# Patient Record
Sex: Male | Born: 1975 | Race: White | Hispanic: No | Marital: Married | Smoking: Never smoker
Health system: Southern US, Community
[De-identification: ages and names within clinical notes are randomized; demographics above are authoritative.]

## PROBLEM LIST (undated history)

## (undated) DIAGNOSIS — E785 Hyperlipidemia, unspecified: Secondary | ICD-10-CM

## (undated) DIAGNOSIS — R748 Abnormal levels of other serum enzymes: Secondary | ICD-10-CM

## (undated) DIAGNOSIS — M1990 Unspecified osteoarthritis, unspecified site: Secondary | ICD-10-CM

## (undated) DIAGNOSIS — T7840XA Allergy, unspecified, initial encounter: Secondary | ICD-10-CM

## (undated) DIAGNOSIS — J45909 Unspecified asthma, uncomplicated: Secondary | ICD-10-CM

## (undated) HISTORY — DX: Unspecified osteoarthritis, unspecified site: M19.90

## (undated) HISTORY — DX: Abnormal levels of other serum enzymes: R74.8

## (undated) HISTORY — PX: TESTICLE SURGERY: SHX794

## (undated) HISTORY — DX: Unspecified asthma, uncomplicated: J45.909

## (undated) HISTORY — DX: Hyperlipidemia, unspecified: E78.5

## (undated) HISTORY — DX: Allergy, unspecified, initial encounter: T78.40XA

---

## 2011-12-20 HISTORY — PX: VASECTOMY: SHX75

## 2020-02-06 ENCOUNTER — Ambulatory Visit: Payer: Self-pay | Admitting: Family Medicine

## 2020-02-17 ENCOUNTER — Other Ambulatory Visit: Payer: Self-pay

## 2020-02-17 ENCOUNTER — Encounter: Payer: Self-pay | Admitting: Family Medicine

## 2020-02-17 ENCOUNTER — Ambulatory Visit (INDEPENDENT_AMBULATORY_CARE_PROVIDER_SITE_OTHER): Payer: 59 | Admitting: Family Medicine

## 2020-02-17 VITALS — BP 124/82 | HR 69 | Temp 97.1°F | Ht 75.0 in | Wt 281.2 lb

## 2020-02-17 DIAGNOSIS — Z23 Encounter for immunization: Secondary | ICD-10-CM | POA: Diagnosis not present

## 2020-02-17 DIAGNOSIS — R748 Abnormal levels of other serum enzymes: Secondary | ICD-10-CM | POA: Insufficient documentation

## 2020-02-17 DIAGNOSIS — Z0000 Encounter for general adult medical examination without abnormal findings: Secondary | ICD-10-CM | POA: Diagnosis not present

## 2020-02-17 NOTE — Patient Instructions (Signed)
Health Maintenance, Male Adopting a healthy lifestyle and getting preventive care are important in promoting health and wellness. Ask your health care provider about:  The right schedule for you to have regular tests and exams.  Things you can do on your own to prevent diseases and keep yourself healthy. What should I know about diet, weight, and exercise? Eat a healthy diet   Eat a diet that includes plenty of vegetables, fruits, low-fat dairy products, and lean protein.  Do not eat a lot of foods that are high in solid fats, added sugars, or sodium. Maintain a healthy weight Body mass index (BMI) is a measurement that can be used to identify possible weight problems. It estimates body fat based on height and weight. Your health care provider can help determine your BMI and help you achieve or maintain a healthy weight. Get regular exercise Get regular exercise. This is one of the most important things you can do for your health. Most adults should:  Exercise for at least 150 minutes each week. The exercise should increase your heart rate and make you sweat (moderate-intensity exercise).  Do strengthening exercises at least twice a week. This is in addition to the moderate-intensity exercise.  Spend less time sitting. Even light physical activity can be beneficial. Watch cholesterol and blood lipids Have your blood tested for lipids and cholesterol at 45 years of age, then have this test every 5 years. You may need to have your cholesterol levels checked more often if:  Your lipid or cholesterol levels are high.  You are older than 45 years of age.  You are at high risk for heart disease. What should I know about cancer screening? Many types of cancers can be detected early and may often be prevented. Depending on your health history and family history, you may need to have cancer screening at various ages. This may include screening for:  Colorectal cancer.  Prostate cancer.   Skin cancer.  Lung cancer. What should I know about heart disease, diabetes, and high blood pressure? Blood pressure and heart disease  High blood pressure causes heart disease and increases the risk of stroke. This is more likely to develop in people who have high blood pressure readings, are of African descent, or are overweight.  Talk with your health care provider about your target blood pressure readings.  Have your blood pressure checked: ? Every 3-5 years if you are 18-39 years of age. ? Every year if you are 40 years old or older.  If you are between the ages of 65 and 75 and are a current or former smoker, ask your health care provider if you should have a one-time screening for abdominal aortic aneurysm (AAA). Diabetes Have regular diabetes screenings. This checks your fasting blood sugar level. Have the screening done:  Once every three years after age 45 if you are at a normal weight and have a low risk for diabetes.  More often and at a younger age if you are overweight or have a high risk for diabetes. What should I know about preventing infection? Hepatitis B If you have a higher risk for hepatitis B, you should be screened for this virus. Talk with your health care provider to find out if you are at risk for hepatitis B infection. Hepatitis C Blood testing is recommended for:  Everyone born from 1945 through 1965.  Anyone with known risk factors for hepatitis C. Sexually transmitted infections (STIs)  You should be screened each year   for STIs, including gonorrhea and chlamydia, if: ? You are sexually active and are younger than 45 years of age. ? You are older than 45 years of age and your health care provider tells you that you are at risk for this type of infection. ? Your sexual activity has changed since you were last screened, and you are at increased risk for chlamydia or gonorrhea. Ask your health care provider if you are at risk.  Ask your health care  provider about whether you are at high risk for HIV. Your health care provider may recommend a prescription medicine to help prevent HIV infection. If you choose to take medicine to prevent HIV, you should first get tested for HIV. You should then be tested every 3 months for as long as you are taking the medicine. Follow these instructions at home: Lifestyle  Do not use any products that contain nicotine or tobacco, such as cigarettes, e-cigarettes, and chewing tobacco. If you need help quitting, ask your health care provider.  Do not use street drugs.  Do not share needles.  Ask your health care provider for help if you need support or information about quitting drugs. Alcohol use  Do not drink alcohol if your health care provider tells you not to drink.  If you drink alcohol: ? Limit how much you have to 0-2 drinks a day. ? Be aware of how much alcohol is in your drink. In the [place] one drink equals one 12 oz bottle of beer (355 mL), one 5 oz glass of wine (148 mL), or one 1 oz glass of hard liquor (44 mL). General instructions  Schedule regular health, dental, and eye exams.  Stay current with your vaccines.  Tell your health care provider if: ? You often feel depressed. ? You have ever been abused or do not feel safe at home. Summary  Adopting a healthy lifestyle and getting preventive care are important in promoting health and wellness.  Follow your health care provider's instructions about healthy diet, exercising, and getting tested or screened for diseases.  Follow your health care provider's instructions on monitoring your cholesterol and blood pressure. This information is not intended to replace advice given to you by your health care provider. Make sure you discuss any questions you have with your health care provider. Document Revised: 11/28/2018 Document Reviewed: 11/28/2018 Elsevier Patient Education  2020 [employer]  Preventive Care 61-26 Years Old, Male  Preventive care refers to lifestyle choices and visits with your health care provider that can promote health and wellness. This includes:  A yearly physical exam. This is also called an annual well check.  Regular dental and eye exams.  Immunizations.  Screening for certain conditions.  Healthy lifestyle choices, such as eating a healthy diet, getting regular exercise, not using drugs or products that contain nicotine and tobacco, and limiting alcohol use. What can I expect for my preventive care visit? Physical exam Your health care provider will check:  Height and weight. These may be used to calculate body mass index (BMI), which is a measurement that tells if you are at a healthy weight.  Heart rate and blood pressure.  Your skin for abnormal spots. Counseling Your health care provider may ask you questions about:  Alcohol, tobacco, and drug use.  Emotional well-being.  Home and relationship well-being.  Sexual activity.  Eating habits.  Work and work Statistician. What immunizations do I need?  Influenza (flu) vaccine  This is recommended every year. Tetanus, diphtheria,  and pertussis (Tdap) vaccine  You may need a Td booster every 10 years. Varicella (chickenpox) vaccine  You may need this vaccine if you have not already been vaccinated. Zoster (shingles) vaccine  You may need this after age 32. Measles, mumps, and rubella (MMR) vaccine  You may need at least one dose of MMR if you were born in 1957 or later. You may also need a second dose. Pneumococcal conjugate (PCV13) vaccine  You may need this if you have certain conditions and were not previously vaccinated. Pneumococcal polysaccharide (PPSV23) vaccine  You may need one or two doses if you smoke cigarettes or if you have certain conditions. Meningococcal conjugate (MenACWY) vaccine  You may need this if you have certain conditions. Hepatitis A vaccine  You may need this if you have certain  conditions or if you travel or work in places where you may be exposed to hepatitis A. Hepatitis B vaccine  You may need this if you have certain conditions or if you travel or work in places where you may be exposed to hepatitis B. Haemophilus influenzae type b (Hib) vaccine  You may need this if you have certain risk factors. Human papillomavirus (HPV) vaccine  If recommended by your health care provider, you may need three doses over 6 months. You may receive vaccines as individual doses or as more than one vaccine together in one shot (combination vaccines). Talk with your health care provider about the risks and benefits of combination vaccines. What tests do I need? Blood tests  Lipid and cholesterol levels. These may be checked every 5 years, or more frequently if you are over 84 years old.  Hepatitis C test.  Hepatitis B test. Screening  Lung cancer screening. You may have this screening every year starting at age 51 if you have a 30-pack-year history of smoking and currently smoke or have quit within the past 15 years.  Prostate cancer screening. Recommendations will vary depending on your family history and other risks.  Colorectal cancer screening. All adults should have this screening starting at age 27 and continuing until age 50. Your health care provider may recommend screening at age 36 if you are at increased risk. You will have tests every 1-10 years, depending on your results and the type of screening test.  Diabetes screening. This is done by checking your blood sugar (glucose) after you have not eaten for a while (fasting). You may have this done every 1-3 years.  Sexually transmitted disease (STD) testing. Follow these instructions at home: Eating and drinking  Eat a diet that includes fresh fruits and vegetables, whole grains, lean protein, and low-fat dairy products.  Take vitamin and mineral supplements as recommended by your health care provider.  Do not  drink alcohol if your health care provider tells you not to drink.  If you drink alcohol: ? Limit how much you have to 0-2 drinks a day. ? Be aware of how much alcohol is in your drink. In the [place] one drink equals one 12 oz bottle of beer (355 mL), one 5 oz glass of wine (148 mL), or one 1 oz glass of hard liquor (44 mL). Lifestyle  Take daily care of your teeth and gums.  Stay active. Exercise for at least 30 minutes on 5 or more days each week.  Do not use any products that contain nicotine or tobacco, such as cigarettes, e-cigarettes, and chewing tobacco. If you need help quitting, ask your health care provider.  If  you are sexually active, practice safe sex. Use a condom or other form of protection to prevent STIs (sexually transmitted infections).  Talk with your health care provider about taking a low-dose aspirin every day starting at age 50. What's next?  Go to your health care provider once a year for a well check visit.  Ask your health care provider how often you should have your eyes and teeth checked.  Stay up to date on all vaccines. This information is not intended to replace advice given to you by your health care provider. Make sure you discuss any questions you have with your health care provider. Document Revised: 11/29/2018 Document Reviewed: 11/29/2018 Elsevier Patient Education  2020 [employer] Inc.  

## 2020-02-17 NOTE — Progress Notes (Signed)
New Patient Office Visit  Subjective:  Patient ID: Jorge Morris, male    DOB: 1976/12/16  Age: 44 y.o. MRN: [id number]  CC:  Chief Complaint  Patient presents with  . Establish Care    New patient, no concerns.     HPI Jorge Morris presents for establishment of care and a physical exam.  He is nonfasting today.  He brings in labs that were drawn at his company back in November.  Labs were essentially normal but AST was elevated, LDL mildly elevated at 128 with an HDL of 33.  He is healthy as far as he knows and does not take any medications.  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  He is married and has 4 children of various ages.  He does not smoke or use illicit drugs.  He rarely drinks alcohol.  Just started working out again this past month.  He is in graduate school time and short for him.  Just had dental care this past month. History reviewed. No pertinent past medical history.  Past Surgical History:  Procedure Laterality Date  . TESTICLE SURGERY      Family History  Problem Relation Age of Onset  . Cancer Mother   . Healthy Father   . Cancer Maternal Grandmother   . Cancer Maternal Grandfather     Social History   Socioeconomic History  . Marital status: Married    Spouse name: Not on file  . Number of children: Not on file  . Years of education: Not on file  . Highest education level: Not on file  Occupational History  . Not on file  Tobacco Use  . Smoking status: Never Smoker  . Smokeless tobacco: Never Used  Substance and Sexual Activity  . Alcohol use: Not Currently  . Drug use: Never  . Sexual activity: Yes  Other Topics Concern  . Not on file  Social History Narrative  . Not on file   Social Determinants of Health   Financial Resource Strain:   . Difficulty of Paying Living Expenses: Not on file  Food Insecurity:   . Worried About Programme researcher, broadcasting/film/video in the Last Year: Not on file  . Ran Out of Food in the Last Year: Not on file    Transportation Needs:   . Lack of Transportation (Medical): Not on file  . Lack of Transportation (Non-Medical): Not on file  Physical Activity:   . Days of Exercise per Week: Not on file  . Minutes of Exercise per Session: Not on file  Stress:   . Feeling of Stress : Not on file  Social Connections:   . Frequency of Communication with Friends and Family: Not on file  . Frequency of Social Gatherings with Friends and Family: Not on file  . Attends Religious Services: Not on file  . Active Member of Clubs or Organizations: Not on file  . Attends Banker Meetings: Not on file  . Marital Status: Not on file  Intimate Partner Violence:   . Fear of Current or Ex-Partner: Not on file  . Emotionally Abused: Not on file  . Physically Abused: Not on file  . Sexually Abused: Not on file    ROS Review of Systems  Constitutional: Negative.   HENT: Negative.   Eyes: Negative for photophobia and visual disturbance.  Respiratory: Negative.   Cardiovascular: Negative.   Gastrointestinal: Negative.   Endocrine: Negative for polyphagia and polyuria.  Genitourinary: Negative.  Musculoskeletal: Negative for gait problem and joint swelling.  Skin: Negative for pallor and rash.  Allergic/Immunologic: Negative for immunocompromised state.  Neurological: Negative for light-headedness and headaches.  Hematological: Does not bruise/bleed easily.  Psychiatric/Behavioral: Negative.     Objective:   Today's Vitals: BP 124/82   Pulse 69   Temp (!) 97.1 F (36.2 C) (Tympanic)   Ht 6\' 3"  (1.905 m)   Wt 281 lb 3.2 oz (127.6 kg)   SpO2 95%   BMI 35.15 kg/m   Physical Exam Constitutional:      General: He is not in acute distress.    Appearance: Normal appearance. He is not ill-appearing, toxic-appearing or diaphoretic.  HENT:     Head: Atraumatic.     Right Ear: Tympanic membrane, ear canal and external ear normal. There is no impacted cerumen.     Left Ear: Tympanic  membrane, ear canal and external ear normal. There is no impacted cerumen.  Eyes:     General: No scleral icterus.       Right eye: No discharge.        Left eye: No discharge.     Extraocular Movements: Extraocular movements intact.     Conjunctiva/sclera: Conjunctivae normal.     Pupils: Pupils are equal, round, and reactive to light.  Cardiovascular:     Rate and Rhythm: Normal rate and regular rhythm.  Pulmonary:     Effort: Pulmonary effort is normal.     Breath sounds: Normal breath sounds.  Abdominal:     General: Abdomen is flat. Bowel sounds are normal.     Palpations: Abdomen is soft. There is no mass.     Tenderness: There is no guarding or rebound.     Hernia: No hernia is present. There is no hernia in the left inguinal area or right inguinal area.  Genitourinary:    Penis: Uncircumcised. No phimosis, paraphimosis, hypospadias, erythema, tenderness, discharge, swelling or lesions.      Testes: Normal.        Right: Mass, tenderness or swelling not present. Right testis is descended.        Left: Tenderness or swelling not present. Left testis is descended.     Epididymis:     Right: Not inflamed.     Left: Not inflamed.  Musculoskeletal:     Cervical back: No rigidity or tenderness.     Right lower leg: No edema.     Left lower leg: No edema.  Lymphadenopathy:     Cervical: No cervical adenopathy.     Lower Body: No right inguinal adenopathy. No left inguinal adenopathy.  Skin:    General: Skin is warm and dry.     Coloration: Skin is not jaundiced.  Neurological:     Mental Status: He is alert and oriented to person, place, and time.  Psychiatric:        Mood and Affect: Mood normal.        Behavior: Behavior normal.     Assessment & Plan:   Problem List Items Addressed This Visit      Other   Elevated liver enzymes   Relevant Orders   Comprehensive metabolic panel   Gamma GT   Hepatitis C antibody   Hepatitis B Surface AntiGEN   Hepatitis B  surface antibody,quantitative   Healthcare maintenance - Primary   Relevant Orders   CBC   Comprehensive metabolic panel   LDL cholesterol, direct   Lipid panel   Urinalysis, Routine w reflex  microscopic   HIV Antibody (routine testing w rflx)    Other Visit Diagnoses    Need for Tdap vaccination       Relevant Orders   Tdap vaccine greater than or equal to 7yo IM (Completed)      No outpatient encounter medications on file as of 02/17/2020.   No facility-administered encounter medications on file as of 02/17/2020.    Follow-up: Return in about 6 months (around 08/19/2020), or return fasting for your blood work..   Patient was given information on health maintenance and disease prevention.  He will return fasting for above ordered blood work and again in 6 months for follow-up.  Libby Maw, MD

## 2020-02-21 ENCOUNTER — Other Ambulatory Visit (INDEPENDENT_AMBULATORY_CARE_PROVIDER_SITE_OTHER): Payer: 59

## 2020-02-21 ENCOUNTER — Other Ambulatory Visit: Payer: Self-pay

## 2020-02-21 DIAGNOSIS — R748 Abnormal levels of other serum enzymes: Secondary | ICD-10-CM

## 2020-02-21 DIAGNOSIS — Z0000 Encounter for general adult medical examination without abnormal findings: Secondary | ICD-10-CM

## 2020-02-21 LAB — URINALYSIS, ROUTINE W REFLEX MICROSCOPIC
Bilirubin Urine: NEGATIVE
Hgb urine dipstick: NEGATIVE
Ketones, ur: NEGATIVE
Leukocytes,Ua: NEGATIVE
Nitrite: NEGATIVE
Specific Gravity, Urine: 1.02 (ref 1.000–1.030)
Total Protein, Urine: NEGATIVE
Urine Glucose: NEGATIVE
Urobilinogen, UA: 0.2 (ref 0.0–1.0)
pH: 6.5 (ref 5.0–8.0)

## 2020-02-21 LAB — COMPREHENSIVE METABOLIC PANEL
ALT: 26 U/L (ref 0–53)
AST: 17 U/L (ref 0–37)
Albumin: 4.1 g/dL (ref 3.5–5.2)
Alkaline Phosphatase: 58 U/L (ref 39–117)
BUN: 20 mg/dL (ref 6–23)
CO2: 28 mEq/L (ref 19–32)
Calcium: 9.2 mg/dL (ref 8.4–10.5)
Chloride: 105 mEq/L (ref 96–112)
Creatinine, Ser: 1.41 mg/dL (ref 0.40–1.50)
GFR: 54.32 mL/min — ABNORMAL LOW (ref >60.00)
Glucose, Bld: 98 mg/dL (ref 70–99)
Potassium: 4.6 mEq/L (ref 3.5–5.1)
Sodium: 138 mEq/L (ref 135–145)
Total Bilirubin: 0.7 mg/dL (ref 0.2–1.2)
Total Protein: 6.4 g/dL (ref 6.0–8.3)

## 2020-02-21 LAB — LIPID PANEL
Cholesterol: 163 mg/dL (ref 0–200)
HDL: 28.8 mg/dL — ABNORMAL LOW (ref >39.00)
LDL Cholesterol: 118 mg/dL — ABNORMAL HIGH (ref 0–99)
NonHDL: 134.02
Total CHOL/HDL Ratio: 6
Triglycerides: 82 mg/dL (ref 0.0–149.0)
VLDL: 16.4 mg/dL (ref 0.0–40.0)

## 2020-02-21 LAB — CBC
HCT: 46.1 % (ref 39.0–52.0)
Hemoglobin: 15.6 g/dL (ref 13.0–17.0)
MCHC: 33.9 g/dL (ref 30.0–36.0)
MCV: 92.8 fl (ref 78.0–100.0)
Platelets: 195 10*3/uL (ref 150.0–400.0)
RBC: 4.96 Mil/uL (ref 4.22–5.81)
RDW: 12.9 % (ref 11.5–15.5)
WBC: 3.8 10*3/uL — ABNORMAL LOW (ref 4.0–10.5)

## 2020-02-21 LAB — GAMMA GT: GGT: 26 U/L (ref 7–51)

## 2020-02-21 LAB — LDL CHOLESTEROL, DIRECT: Direct LDL: 118 mg/dL

## 2020-02-24 LAB — HIV ANTIBODY (ROUTINE TESTING W REFLEX): HIV 1&2 Ab, 4th Generation: NONREACTIVE

## 2020-02-24 LAB — HEPATITIS B SURFACE ANTIBODY, QUANTITATIVE: Hep B S AB Quant (Post): <5 m[IU]/mL — ABNORMAL LOW (ref >=10)

## 2020-02-24 LAB — HEPATITIS C ANTIBODY
Hepatitis C Ab: NONREACTIVE
SIGNAL TO CUT-OFF: 0.01 (ref <1.00)

## 2020-02-24 LAB — HEPATITIS B SURFACE ANTIGEN: Hepatitis B Surface Ag: NONREACTIVE

## 2020-08-21 ENCOUNTER — Ambulatory Visit: Payer: 59 | Admitting: Family Medicine

## 2020-08-21 ENCOUNTER — Other Ambulatory Visit: Payer: Self-pay

## 2020-08-21 ENCOUNTER — Ambulatory Visit: Payer: 59

## 2020-08-21 ENCOUNTER — Ambulatory Visit (INDEPENDENT_AMBULATORY_CARE_PROVIDER_SITE_OTHER): Payer: 59

## 2020-08-21 ENCOUNTER — Encounter: Payer: Self-pay | Admitting: Family Medicine

## 2020-08-21 VITALS — BP 134/86 | HR 63 | Temp 97.8°F | Ht 75.0 in | Wt 265.8 lb

## 2020-08-21 DIAGNOSIS — E7800 Pure hypercholesterolemia, unspecified: Secondary | ICD-10-CM | POA: Diagnosis not present

## 2020-08-21 DIAGNOSIS — R748 Abnormal levels of other serum enzymes: Secondary | ICD-10-CM | POA: Diagnosis not present

## 2020-08-21 DIAGNOSIS — M25561 Pain in right knee: Secondary | ICD-10-CM | POA: Insufficient documentation

## 2020-08-21 DIAGNOSIS — E663 Overweight: Secondary | ICD-10-CM | POA: Diagnosis not present

## 2020-08-21 DIAGNOSIS — M25562 Pain in left knee: Secondary | ICD-10-CM | POA: Diagnosis not present

## 2020-08-21 LAB — LIPID PANEL
Cholesterol: 171 mg/dL (ref 0–200)
HDL: 30.9 mg/dL — ABNORMAL LOW (ref >39.00)
LDL Cholesterol: 125 mg/dL — ABNORMAL HIGH (ref 0–99)
NonHDL: 140.33
Total CHOL/HDL Ratio: 6
Triglycerides: 77 mg/dL (ref 0.0–149.0)
VLDL: 15.4 mg/dL (ref 0.0–40.0)

## 2020-08-21 LAB — CBC
HCT: 47.3 % (ref 39.0–52.0)
Hemoglobin: 15.9 g/dL (ref 13.0–17.0)
MCHC: 33.7 g/dL (ref 30.0–36.0)
MCV: 93.4 fl (ref 78.0–100.0)
Platelets: 202 10*3/uL (ref 150.0–400.0)
RBC: 5.06 Mil/uL (ref 4.22–5.81)
RDW: 12.9 % (ref 11.5–15.5)
WBC: 4.9 10*3/uL (ref 4.0–10.5)

## 2020-08-21 LAB — BASIC METABOLIC PANEL
BUN: 25 mg/dL — ABNORMAL HIGH (ref 6–23)
CO2: 26 mEq/L (ref 19–32)
Calcium: 9.2 mg/dL (ref 8.4–10.5)
Chloride: 105 mEq/L (ref 96–112)
Creatinine, Ser: 1.16 mg/dL (ref 0.40–1.50)
GFR: 67.89 mL/min (ref >60.00)
Glucose, Bld: 85 mg/dL (ref 70–99)
Potassium: 4.4 mEq/L (ref 3.5–5.1)
Sodium: 139 mEq/L (ref 135–145)

## 2020-08-21 LAB — HEPATIC FUNCTION PANEL
ALT: 44 U/L (ref 0–53)
AST: 24 U/L (ref 0–37)
Albumin: 4.5 g/dL (ref 3.5–5.2)
Alkaline Phosphatase: 62 U/L (ref 39–117)
Bilirubin, Direct: 0.1 mg/dL (ref 0.0–0.3)
Total Bilirubin: 0.6 mg/dL (ref 0.2–1.2)
Total Protein: 6.5 g/dL (ref 6.0–8.3)

## 2020-08-21 NOTE — Addendum Note (Signed)
Addended by: Varney Biles on: 08/21/2020 10:42 AM   Modules accepted: Orders

## 2020-08-21 NOTE — Progress Notes (Addendum)
Established Patient Office Visit  Subjective:  Patient ID: Jorge Morris, male    DOB: [birth year]  Age: 44 y.o. MRN: [id number]  CC:  Chief Complaint  Patient presents with  . Follow-up    6 month follow up, no concern.     HPI Jorge Morris presents for follow-up of elevated LDL cholesterol and elevated liver enzymes.  Patient has been working on losing weight.  He has been having some problems with with his knees in the left knee in particular.  Feels as though it starts to give way at times.  Denies locking up for past medical history of significant injury to the knee.  He has experienced a significant hamstring pull in his past on the left side. Has some right knee pain as well.  History reviewed. No pertinent past medical history.  Past Surgical History:  Procedure Laterality Date  . TESTICLE SURGERY      Family History  Problem Relation Age of Onset  . Cancer Mother   . Healthy Father   . Cancer Maternal Grandmother   . Cancer Maternal Grandfather     Social History   Socioeconomic History  . Marital status: Married    Spouse name: Not on file  . Number of children: Not on file  . Years of education: Not on file  . Highest education level: Not on file  Occupational History  . Not on file  Tobacco Use  . Smoking status: Never Smoker  . Smokeless tobacco: Never Used  Vaping Use  . Vaping Use: Never used  Substance and Sexual Activity  . Alcohol use: Not Currently  . Drug use: Never  . Sexual activity: Yes  Other Topics Concern  . Not on file  Social History Narrative  . Not on file   Social Determinants of Health   Financial Resource Strain:   . Difficulty of Paying Living Expenses: Not on file  Food Insecurity:   . Worried About Programme researcher, broadcasting/film/video in the Last Year: Not on file  . Ran Out of Food in the Last Year: Not on file  Transportation Needs:   . Lack of Transportation (Medical): Not on file  . Lack of Transportation (Non-Medical): Not on  file  Physical Activity:   . Days of Exercise per Week: Not on file  . Minutes of Exercise per Session: Not on file  Stress:   . Feeling of Stress : Not on file  Social Connections:   . Frequency of Communication with Friends and Family: Not on file  . Frequency of Social Gatherings with Friends and Family: Not on file  . Attends Religious Services: Not on file  . Active Member of Clubs or Organizations: Not on file  . Attends Banker Meetings: Not on file  . Marital Status: Not on file  Intimate Partner Violence:   . Fear of Current or Ex-Partner: Not on file  . Emotionally Abused: Not on file  . Physically Abused: Not on file  . Sexually Abused: Not on file    No outpatient medications prior to visit.   No facility-administered medications prior to visit.    Not on File  ROS Review of Systems  Constitutional: Negative.   Respiratory: Negative.   Cardiovascular: Negative.   Gastrointestinal: Negative.   Endocrine: Negative for polyphagia and polyuria.  Genitourinary: Negative.   Musculoskeletal: Positive for arthralgias. Negative for gait problem.  Skin: Negative for pallor and rash.  Hematological: Does not bruise/bleed easily.  Psychiatric/Behavioral:  Negative.       Objective:    Physical Exam Constitutional:      General: He is not in acute distress.    Appearance: Normal appearance. He is not ill-appearing, toxic-appearing or diaphoretic.  HENT:     Head: Normocephalic and atraumatic.     Right Ear: Tympanic membrane and external ear normal.     Left Ear: Tympanic membrane, ear canal and external ear normal.  Eyes:     General: No scleral icterus.       Right eye: No discharge.        Left eye: No discharge.     Extraocular Movements: Extraocular movements intact.     Conjunctiva/sclera: Conjunctivae normal.     Pupils: Pupils are equal, round, and reactive to light.  Cardiovascular:     Rate and Rhythm: Normal rate and regular rhythm.    Pulmonary:     Effort: Pulmonary effort is normal.     Breath sounds: Normal breath sounds.  Musculoskeletal:     Cervical back: No rigidity or tenderness.     Left knee: No swelling, deformity, effusion or ecchymosis. Normal range of motion. Tenderness present over the medial joint line.     Right lower leg: No edema.     Left lower leg: No edema.       Legs:  Lymphadenopathy:     Cervical: No cervical adenopathy.  Skin:    General: Skin is warm and dry.  Neurological:     Mental Status: He is alert and oriented to person, place, and time.  Psychiatric:        Mood and Affect: Mood normal.        Behavior: Behavior normal.     BP 134/86   Pulse 63   Temp 97.8 F (36.6 C) (Tympanic)   Ht 6\' 3"  (1.905 m)   Wt 265 lb 12.8 oz (120.6 kg)   SpO2 96%   BMI 33.22 kg/m  Wt Readings from Last 3 Encounters:  08/21/20 265 lb 12.8 oz (120.6 kg)  02/17/20 281 lb 3.2 oz (127.6 kg)     Health Maintenance Due  Topic Date Due  . INFLUENZA VACCINE  Never done    There are no preventive care reminders to display for this patient.  No results found for: TSH Lab Results  Component Value Date   WBC 3.8 (L) 02/21/2020   HGB 15.6 02/21/2020   HCT 46.1 02/21/2020   MCV 92.8 02/21/2020   PLT 195.0 02/21/2020   Lab Results  Component Value Date   NA 138 02/21/2020   K 4.6 02/21/2020   CO2 28 02/21/2020   GLUCOSE 98 02/21/2020   BUN 20 02/21/2020   CREATININE 1.41 02/21/2020   BILITOT 0.7 02/21/2020   ALKPHOS 58 02/21/2020   AST 17 02/21/2020   ALT 26 02/21/2020   PROT 6.4 02/21/2020   ALBUMIN 4.1 02/21/2020   CALCIUM 9.2 02/21/2020   GFR 54.32 (L) 02/21/2020   Lab Results  Component Value Date   CHOL 163 02/21/2020   Lab Results  Component Value Date   HDL 28.80 (L) 02/21/2020   Lab Results  Component Value Date   LDLCALC 118 (H) 02/21/2020   Lab Results  Component Value Date   TRIG 82.0 02/21/2020   Lab Results  Component Value Date   CHOLHDL 6  02/21/2020   No results found for: HGBA1C    Assessment & Plan:   Problem List Items Addressed This Visit  Other   Elevated liver enzymes - Primary   Relevant Orders   Hepatic function panel   CBC   Basic metabolic panel   Lipid panel   Over weight   Elevated LDL cholesterol level   Relevant Orders   Lipid panel   Left knee pain   Relevant Orders   DG Knee Complete 4 Views Left   Right knee pain   Relevant Orders   DG Knee Complete 4 Views Right      No orders of the defined types were placed in this encounter.   Follow-up: Return in about 1 year (around 08/21/2021), or Sports Medicine referal as needed for knee pain..  Given information on calorie counting to lose weight.  Suggested weight watchers.  Consider sports medicine referral for ongoing knee pain.  Discussed that weight loss is more about the diet and exercise although exercise is extremely important.  We recommend at least 30 minutes 5 days a week.  Suggested that weight loss may help his knee.  Sports medicine referral if not improving.  Mliss Sax, MD

## 2020-08-21 NOTE — Addendum Note (Signed)
Addended by: Andrez Grime on: 08/21/2020 08:59 AM   Modules accepted: Orders

## 2020-08-21 NOTE — Patient Instructions (Addendum)
Calorie Counting for Weight Loss Calories are units of energy. Your body needs a certain amount of calories from food to keep you going throughout the day. When you eat more calories than your body needs, your body stores the extra calories as fat. When you eat fewer calories than your body needs, your body burns fat to get the energy it needs. Calorie counting means keeping track of how many calories you eat and drink each day. Calorie counting can be helpful if you need to lose weight. If you make sure to eat fewer calories than your body needs, you should lose weight. Ask your health care provider what a healthy weight is for you. For calorie counting to work, you will need to eat the right number of calories in a day in order to lose a healthy amount of weight per week. A dietitian can help you determine how many calories you need in a day and will give you suggestions on how to reach your calorie goal.  A healthy amount of weight to lose per week is usually 1-2 lb (0.5-0.9 kg). This usually means that your daily calorie intake should be reduced by 500-750 calories.  Eating 1,200 - 1,500 calories per day can help most women lose weight.  Eating 1,500 - 1,800 calories per day can help most men lose weight. What is my plan? My goal is to have __________ calories per day. If I have this many calories per day, I should lose around __________ pounds per week. What do I need to know about calorie counting? In order to meet your daily calorie goal, you will need to:  Find out how many calories are in each food you would like to eat. Try to do this before you eat.  Decide how much of the food you plan to eat.  Write down what you ate and how many calories it had. Doing this is called keeping a food log. To successfully lose weight, it is important to balance calorie counting with a healthy lifestyle that includes regular activity. Aim for 150 minutes of moderate exercise (such as walking) or 75  minutes of vigorous exercise (such as running) each week. Where do I find calorie information?  The number of calories in a food can be found on a Nutrition Facts label. If a food does not have a Nutrition Facts label, try to look up the calories online or ask your dietitian for help. Remember that calories are listed per serving. If you choose to have more than one serving of a food, you will have to multiply the calories per serving by the amount of servings you plan to eat. For example, the label on a package of bread might say that a serving size is 1 slice and that there are 90 calories in a serving. If you eat 1 slice, you will have eaten 90 calories. If you eat 2 slices, you will have eaten 180 calories. How do I keep a food log? Immediately after each meal, record the following information in your food log:  What you ate. Don't forget to include toppings, sauces, and other extras on the food.  How much you ate. This can be measured in cups, ounces, or number of items.  How many calories each food and drink had.  The total number of calories in the meal. Keep your food log near you, such as in a small notebook in your pocket, or use a mobile app or website. Some programs will calculate   calories for you and show you how many calories you have left for the day to meet your goal. What are some calorie counting tips?   Use your calories on foods and drinks that will fill you up and not leave you hungry: ? Some examples of foods that fill you up are nuts and nut butters, vegetables, lean proteins, and high-fiber foods like whole grains. High-fiber foods are foods with more than 5 g fiber per serving. ? Drinks such as sodas, specialty coffee drinks, alcohol, and juices have a lot of calories, yet do not fill you up.  Eat nutritious foods and avoid empty calories. Empty calories are calories you get from foods or beverages that do not have many vitamins or protein, such as candy, sweets, and  soda. It is better to have a nutritious high-calorie food (such as an avocado) than a food with few nutrients (such as a bag of chips).  Know how many calories are in the foods you eat most often. This will help you calculate calorie counts faster.  Pay attention to calories in drinks. Low-calorie drinks include water and unsweetened drinks.  Pay attention to nutrition labels for "low fat" or "fat free" foods. These foods sometimes have the same amount of calories or more calories than the full fat versions. They also often have added sugar, starch, or salt, to make up for flavor that was removed with the fat.  Find a way of tracking calories that works for you. Get creative. Try different apps or programs if writing down calories does not work for you. What are some portion control tips?  Know how many calories are in a serving. This will help you know how many servings of a certain food you can have.  Use a measuring cup to measure serving sizes. You could also try weighing out portions on a kitchen scale. With time, you will be able to estimate serving sizes for some foods.  Take some time to put servings of different foods on your favorite plates, bowls, and cups so you know what a serving looks like.  Try not to eat straight from a bag or box. Doing this can lead to overeating. Put the amount you would like to eat in a cup or on a plate to make sure you are eating the right portion.  Use smaller plates, glasses, and bowls to prevent overeating.  Try not to multitask (for example, watch TV or use your computer) while eating. If it is time to eat, sit down at a table and enjoy your food. This will help you to know when you are full. It will also help you to be aware of what you are eating and how much you are eating. What are tips for following this plan? Reading food labels  Check the calorie count compared to the serving size. The serving size may be smaller than what you are used to  eating.  Check the source of the calories. Make sure the food you are eating is high in vitamins and protein and low in saturated and trans fats. Shopping  Read nutrition labels while you shop. This will help you make healthy decisions before you decide to purchase your food.  Make a grocery list and stick to it. Cooking  Try to cook your favorite foods in a healthier way. For example, try baking instead of frying.  Use low-fat dairy products. Meal planning  Use more fruits and vegetables. Half of your plate should be fruits   and vegetables.  Include lean proteins like poultry and fish. How do I count calories when eating out?  Ask for smaller portion sizes.  Consider sharing an entree and sides instead of getting your own entree.  If you get your own entree, eat only half. Ask for a box at the beginning of your meal and put the rest of your entree in it so you are not tempted to eat it.  If calories are listed on the menu, choose the lower calorie options.  Choose dishes that include vegetables, fruits, whole grains, low-fat dairy products, and lean protein.  Choose items that are boiled, broiled, grilled, or steamed. Stay away from items that are buttered, battered, fried, or served with cream sauce. Items labeled "crispy" are usually fried, unless stated otherwise.  Choose water, low-fat milk, unsweetened iced tea, or other drinks without added sugar. If you want an alcoholic beverage, choose a lower calorie option such as a glass of wine or light beer.  Ask for dressings, sauces, and syrups on the side. These are usually high in calories, so you should limit the amount you eat.  If you want a salad, choose a garden salad and ask for grilled meats. Avoid extra toppings like bacon, cheese, or fried items. Ask for the dressing on the side, or ask for olive oil and vinegar or lemon to use as dressing.  Estimate how many servings of a food you are given. For example, a serving of  cooked rice is  cup or about the size of half a baseball. Knowing serving sizes will help you be aware of how much food you are eating at restaurants. The list below tells you how big or small some common portion sizes are based on everyday objects: ? 1 oz--4 stacked dice. ? 3 oz--1 deck of cards. ? 1 tsp--1 die. ? 1 Tbsp-- a ping-pong ball. ? 2 Tbsp--1 ping-pong ball. ?  cup-- baseball. ? 1 cup--1 baseball. Summary  Calorie counting means keeping track of how many calories you eat and drink each day. If you eat fewer calories than your body needs, you should lose weight.  A healthy amount of weight to lose per week is usually 1-2 lb (0.5-0.9 kg). This usually means reducing your daily calorie intake by 500-750 calories.  The number of calories in a food can be found on a Nutrition Facts label. If a food does not have a Nutrition Facts label, try to look up the calories online or ask your dietitian for help.  Use your calories on foods and drinks that will fill you up, and not on foods and drinks that will leave you hungry.  Use smaller plates, glasses, and bowls to prevent overeating. This information is not intended to replace advice given to you by your health care provider. Make sure you discuss any questions you have with your health care provider. Document Revised: 08/24/2018 Document Reviewed: 11/04/2016 Elsevier Patient Education  2020 [employer].  Exercising to Stay Healthy To become healthy and stay healthy, it is recommended that you do moderate-intensity and vigorous-intensity exercise. You can tell that you are exercising at a moderate intensity if your heart starts beating faster and you start breathing faster but can still hold a conversation. You can tell that you are exercising at a vigorous intensity if you are breathing much harder and faster and cannot hold a conversation while exercising. Exercising regularly is important. It has many health benefits, such  as:  Improving overall fitness, flexibility,  and endurance.  Increasing bone density.  Helping with weight control.  Decreasing body fat.  Increasing muscle strength.  Reducing stress and tension.  Improving overall health. How often should I exercise? Choose an activity that you enjoy, and set realistic goals. Your health care provider can help you make an activity plan that works for you. Exercise regularly as told by your health care provider. This may include:  Doing strength training two times a week, such as: ? Lifting weights. ? Using resistance bands. ? Push-ups. ? Sit-ups. ? Yoga.  Doing a certain intensity of exercise for a given amount of time. Choose from these options: ? A total of 150 minutes of moderate-intensity exercise every week. ? A total of 75 minutes of vigorous-intensity exercise every week. ? A mix of moderate-intensity and vigorous-intensity exercise every week. Children, pregnant women, people who have not exercised regularly, people who are overweight, and older adults may need to talk with a health care provider about what activities are safe to do. If you have a medical condition, be sure to talk with your health care provider before you start a new exercise program. What are some exercise ideas? Moderate-intensity exercise ideas include:  Walking 1 mile (1.6 km) in about 15 minutes.  Biking.  Hiking.  Golfing.  Dancing.  Water aerobics. Vigorous-intensity exercise ideas include:  Walking 4.5 miles (7.2 km) or more in about 1 hour.  Jogging or running 5 miles (8 km) in about 1 hour.  Biking 10 miles (16.1 km) or more in about 1 hour.  Lap swimming.  Roller-skating or in-line skating.  Cross-country skiing.  Vigorous competitive sports, such as football, basketball, and soccer.  Jumping rope.  Aerobic dancing. What are some everyday activities that can help me to get exercise?  Yard work, such as: ? Pushing a [employer] manager. ? Raking and bagging leaves.  Washing your car.  Pushing a stroller.  Shoveling snow.  Gardening.  Washing windows or floors. How can I be more active in my day-to-day activities?  Use stairs instead of an elevator.  Take a walk during your lunch break.  If you drive, park your car farther away from your work or school.  If you take public transportation, get off one stop early and walk the rest of the way.  Stand up or walk around during all of your indoor phone calls.  Get up, stretch, and walk around every 30 minutes throughout the day.  Enjoy exercise with a friend. Support to continue exercising will help you keep a regular routine of activity. What guidelines can I follow while exercising?  Before you start a new exercise program, talk with your health care provider.  Do not exercise so much that you hurt yourself, feel dizzy, or get very short of breath.  Wear comfortable clothes and wear shoes with good support.  Drink plenty of water while you exercise to prevent dehydration or heat stroke.  Work out until your breathing and your heartbeat get faster. Where to find more information  [place] Department of Health and Human Services: ThisPath.fi  Centers for Disease Control and Prevention (CDC): [employer]Exhibition.com.br Summary  Exercising regularly is important. It will improve your overall fitness, flexibility, and endurance.  Regular exercise also will improve your overall health. It can help you control your weight, reduce stress, and improve your bone density.  Do not exercise so much that you hurt yourself, feel dizzy, or get very short of breath.  Before you start a new  exercise program, talk with your health care provider. This information is not intended to replace advice given to you by your health care provider. Make sure you discuss any questions you have with your health care provider. Document Revised: 11/17/2017 Document Reviewed: 10/26/2017 Elsevier  Patient Education  2020 [employer].

## 2020-08-26 ENCOUNTER — Telehealth: Payer: Self-pay | Admitting: Family Medicine

## 2020-08-26 NOTE — Telephone Encounter (Signed)
Patient is returning the call, please advise. CB is [phone].

## 2020-08-27 NOTE — Telephone Encounter (Signed)
Spoke with patient.

## 2021-08-23 ENCOUNTER — Ambulatory Visit: Payer: 59 | Admitting: Family Medicine

## 2021-08-25 ENCOUNTER — Ambulatory Visit: Payer: 59 | Admitting: Family Medicine

## 2021-08-25 ENCOUNTER — Encounter: Payer: Self-pay | Admitting: Family Medicine

## 2021-08-25 ENCOUNTER — Other Ambulatory Visit: Payer: Self-pay

## 2021-08-25 VITALS — BP 120/82 | HR 65 | Temp 97.6°F | Ht 75.0 in | Wt 276.8 lb

## 2021-08-25 DIAGNOSIS — Z23 Encounter for immunization: Secondary | ICD-10-CM

## 2021-08-25 DIAGNOSIS — Z0000 Encounter for general adult medical examination without abnormal findings: Secondary | ICD-10-CM | POA: Diagnosis not present

## 2021-08-25 DIAGNOSIS — E663 Overweight: Secondary | ICD-10-CM

## 2021-08-25 DIAGNOSIS — M170 Bilateral primary osteoarthritis of knee: Secondary | ICD-10-CM | POA: Diagnosis not present

## 2021-08-25 NOTE — Addendum Note (Signed)
Addended by: Varney Biles on: 08/25/2021 09:28 AM   Modules accepted: Orders

## 2021-08-25 NOTE — Progress Notes (Signed)
Established Patient Office Visit  Subjective:  Patient ID: Jorge Morris, male    DOB: [birth year]  Age: 45 y.o. MRN: [id number]  CC:  Chief Complaint  Patient presents with   Annual Exam    CPE/labs.  Not fasting today.  No concerns.      HPI Jorge Morris presents for his yearly physical.  Things have been going relatively well for him.  Continues to work in [employer] processing [employer] and enjoys spending time with his family.  Recently diagnosed with bilateral osteoarthritis of both knees.  He has changed his Eksir size routine to avoid impact.  Past Medical History:  Diagnosis Date   Allergy     Past Surgical History:  Procedure Laterality Date   TESTICLE SURGERY      Family History  Problem Relation Age of Onset   Cancer Mother    Healthy Father    Cancer Maternal Grandmother    Cancer Maternal Grandfather     Social History   Socioeconomic History   Marital status: Married    Spouse name: Not on file   Number of children: Not on file   Years of education: Not on file   Highest education level: Not on file  Occupational History   Not on file  Tobacco Use   Smoking status: Never   Smokeless tobacco: Never  Vaping Use   Vaping Use: Never used  Substance and Sexual Activity   Alcohol use: Yes    Comment: socially   Drug use: Never   Sexual activity: Yes  Other Topics Concern   Not on file  Social History Narrative   Not on file   Social Determinants of Health   Financial Resource Strain: Not on file  Food Insecurity: Not on file  Transportation Needs: Not on file  Physical Activity: Not on file  Stress: Not on file  Social Connections: Not on file  Intimate Partner Violence: Not on file    Outpatient Medications Prior to Visit  Medication Sig Dispense Refill   levocetirizine (XYZAL) 5 MG tablet Take 5 mg by mouth every evening.     No facility-administered medications prior to visit.    No Known Allergies  ROS Review of Systems  Constitutional:  Negative.   HENT: Negative.    Respiratory: Negative.    Cardiovascular: Negative.   Gastrointestinal: Negative.   Endocrine: Negative for polyphagia and polyuria.  Genitourinary: Negative.   Musculoskeletal:  Positive for arthralgias.  Allergic/Immunologic: Negative for immunocompromised state.  Neurological: Negative.   Psychiatric/Behavioral: Negative.    Depression screen [facility] 2/9 08/25/2021 02/17/2020 02/17/2020  Decreased Interest 0 0 0  Down, Depressed, Hopeless 0 0 0  PHQ - 2 Score 0 0 0  Altered sleeping - 0 -  Tired, decreased energy - 0 -  Change in appetite - 0 -  Feeling bad or failure about yourself  - 0 -  Trouble concentrating - 0 -  Moving slowly or fidgety/restless - 0 -  Suicidal thoughts - 0 -  PHQ-9 Score - 0 -       Objective:    Physical Exam Vitals and nursing note reviewed.  Constitutional:      General: He is not in acute distress.    Appearance: Normal appearance. He is not ill-appearing, toxic-appearing or diaphoretic.  HENT:     Head: Normocephalic and atraumatic.     Right Ear: Tympanic membrane, ear canal and external ear normal.     Left Ear: Tympanic membrane, ear canal and  external ear normal.     Mouth/Throat:     Mouth: Mucous membranes are moist.     Pharynx: Oropharynx is clear. No oropharyngeal exudate or posterior oropharyngeal erythema.  Eyes:     General: No scleral icterus.       Right eye: No discharge.        Left eye: No discharge.     Extraocular Movements: Extraocular movements intact.     Conjunctiva/sclera: Conjunctivae normal.     Pupils: Pupils are equal, round, and reactive to light.  Neck:     Vascular: No carotid bruit.  Cardiovascular:     Rate and Rhythm: Normal rate and regular rhythm.  Pulmonary:     Effort: Pulmonary effort is normal.     Breath sounds: Normal breath sounds.  Abdominal:     General: Abdomen is flat. Bowel sounds are normal. There is no distension.     Palpations: Abdomen is soft. There is no  mass.     Tenderness: There is no abdominal tenderness. There is no guarding or rebound.     Hernia: No hernia is present. There is no hernia in the left inguinal area or right inguinal area.  Genitourinary:    Penis: Normal and uncircumcised. No phimosis, paraphimosis, hypospadias, erythema, tenderness, discharge, swelling or lesions.      Testes: Normal.        Right: Mass, tenderness or swelling not present. Right testis is descended.        Left: Mass, tenderness or swelling not present. Left testis is descended.     Epididymis:     Right: Not inflamed or enlarged.     Left: Not inflamed or enlarged.  Musculoskeletal:     Cervical back: No rigidity or tenderness.  Lymphadenopathy:     Cervical: No cervical adenopathy.     Lower Body: No right inguinal adenopathy. No left inguinal adenopathy.  Skin:    General: Skin is warm and dry.  Neurological:     General: No focal deficit present.     Mental Status: He is alert and oriented to person, place, and time.  Psychiatric:        Mood and Affect: Mood normal.        Behavior: Behavior normal.    BP 120/82   Pulse 65   Temp 97.6 F (36.4 C) (Temporal)   Ht 6\' 3"  (1.905 m)   Wt 276 lb 12.8 oz (125.6 kg)   SpO2 95%   BMI 34.60 kg/m  Wt Readings from Last 3 Encounters:  08/25/21 276 lb 12.8 oz (125.6 kg)  08/21/20 265 lb 12.8 oz (120.6 kg)  02/17/20 281 lb 3.2 oz (127.6 kg)     Health Maintenance Due  Topic Date Due   COLONOSCOPY (Pts 45-26yrs Insurance coverage will need to be confirmed)  Never done   COVID-19 Vaccine (3 - Booster for Pfizer series) 08/27/2020   INFLUENZA VACCINE  Never done    There are no preventive care reminders to display for this patient.  No results found for: TSH Lab Results  Component Value Date   WBC 4.9 08/21/2020   HGB 15.9 08/21/2020   HCT 47.3 08/21/2020   MCV 93.4 08/21/2020   PLT 202.0 08/21/2020   Lab Results  Component Value Date   NA 139 08/21/2020   K 4.4 08/21/2020    CO2 26 08/21/2020   GLUCOSE 85 08/21/2020   BUN 25 (H) 08/21/2020   CREATININE 1.16 08/21/2020   BILITOT 0.6  08/21/2020   ALKPHOS 62 08/21/2020   AST 24 08/21/2020   ALT 44 08/21/2020   PROT 6.5 08/21/2020   ALBUMIN 4.5 08/21/2020   CALCIUM 9.2 08/21/2020   GFR 67.89 08/21/2020   Lab Results  Component Value Date   CHOL 171 08/21/2020   Lab Results  Component Value Date   HDL 30.90 (L) 08/21/2020   Lab Results  Component Value Date   LDLCALC 125 (H) 08/21/2020   Lab Results  Component Value Date   TRIG 77.0 08/21/2020   Lab Results  Component Value Date   CHOLHDL 6 08/21/2020   No results found for: HGBA1C    Assessment & Plan:   Problem List Items Addressed This Visit       Musculoskeletal and Integument   Primary osteoarthritis of both knees     Other   Healthcare maintenance - Primary   Relevant Orders   CBC   Comprehensive metabolic panel   Lipid panel   Urinalysis, Routine w reflex microscopic   Flu Vaccine QUAD 6+ mos PF IM (Fluarix Quad PF)   Ambulatory referral to Gastroenterology   Over weight    No orders of the defined types were placed in this encounter.   Follow-up: Return in about 1 year (around 08/25/2022), or if symptoms worsen or fail to improve.   Given information on health maintenance and disease prevention.  Also discussed his elevated LDL with low HDL.  We will become more aware of fat and cholesterol in his diet.  Given information on elevated BN RIN its impact on health.  Constantly working on losing weight.  Agrees to go for his first colonoscopy.  Will return fasting for above ordered blood work. Mliss Sax, MD

## 2021-08-26 ENCOUNTER — Other Ambulatory Visit: Payer: 59

## 2021-08-27 ENCOUNTER — Other Ambulatory Visit (INDEPENDENT_AMBULATORY_CARE_PROVIDER_SITE_OTHER): Payer: 59

## 2021-08-27 ENCOUNTER — Other Ambulatory Visit: Payer: Self-pay

## 2021-08-27 DIAGNOSIS — Z0000 Encounter for general adult medical examination without abnormal findings: Secondary | ICD-10-CM | POA: Diagnosis not present

## 2021-08-27 LAB — LIPID PANEL
Cholesterol: 193 mg/dL (ref 0–200)
HDL: 32.9 mg/dL — ABNORMAL LOW (ref >39.00)
LDL Cholesterol: 138 mg/dL — ABNORMAL HIGH (ref 0–99)
NonHDL: 160.43
Total CHOL/HDL Ratio: 6
Triglycerides: 114 mg/dL (ref 0.0–149.0)
VLDL: 22.8 mg/dL (ref 0.0–40.0)

## 2021-08-27 LAB — COMPREHENSIVE METABOLIC PANEL
ALT: 47 U/L (ref 0–53)
AST: 23 U/L (ref 0–37)
Albumin: 4.3 g/dL (ref 3.5–5.2)
Alkaline Phosphatase: 64 U/L (ref 39–117)
BUN: 18 mg/dL (ref 6–23)
CO2: 27 mEq/L (ref 19–32)
Calcium: 9.3 mg/dL (ref 8.4–10.5)
Chloride: 104 mEq/L (ref 96–112)
Creatinine, Ser: 1.22 mg/dL (ref 0.40–1.50)
GFR: 71.02 mL/min (ref >60.00)
Glucose, Bld: 89 mg/dL (ref 70–99)
Potassium: 4.4 mEq/L (ref 3.5–5.1)
Sodium: 139 mEq/L (ref 135–145)
Total Bilirubin: 0.7 mg/dL (ref 0.2–1.2)
Total Protein: 6.6 g/dL (ref 6.0–8.3)

## 2021-08-27 LAB — CBC
HCT: 48.7 % (ref 39.0–52.0)
Hemoglobin: 16.4 g/dL (ref 13.0–17.0)
MCHC: 33.8 g/dL (ref 30.0–36.0)
MCV: 92.3 fl (ref 78.0–100.0)
Platelets: 219 10*3/uL (ref 150.0–400.0)
RBC: 5.27 Mil/uL (ref 4.22–5.81)
RDW: 13.3 % (ref 11.5–15.5)
WBC: 4.7 10*3/uL (ref 4.0–10.5)

## 2021-08-27 LAB — URINALYSIS, ROUTINE W REFLEX MICROSCOPIC
Bilirubin Urine: NEGATIVE
Hgb urine dipstick: NEGATIVE
Ketones, ur: NEGATIVE
Leukocytes,Ua: NEGATIVE
Nitrite: NEGATIVE
RBC / HPF: NONE SEEN (ref 0–?)
Specific Gravity, Urine: 1.02 (ref 1.000–1.030)
Total Protein, Urine: NEGATIVE
Urine Glucose: NEGATIVE
Urobilinogen, UA: 0.2 (ref 0.0–1.0)
pH: 6 (ref 5.0–8.0)

## 2021-09-06 ENCOUNTER — Telehealth: Payer: Self-pay | Admitting: Family Medicine

## 2021-09-06 NOTE — Telephone Encounter (Signed)
Went over lab results and recommendations with patient, no concerns.

## 2021-09-06 NOTE — Telephone Encounter (Signed)
Pt requesting call back about labs, please advise

## 2021-09-06 NOTE — Telephone Encounter (Signed)
I missed his call and would like a call back.

## 2021-09-06 NOTE — Telephone Encounter (Signed)
Have called patient several times no answer returned patients call form today to go over labs still no answer LMTCB

## 2021-10-12 ENCOUNTER — Ambulatory Visit: Payer: 59

## 2021-10-12 ENCOUNTER — Telehealth: Payer: Self-pay

## 2021-10-12 NOTE — Progress Notes (Unsigned)

## 2021-10-12 NOTE — Telephone Encounter (Signed)
No return call by EOB to r/s PV.  No show letter sent with procedure cancelled.

## 2021-10-12 NOTE — Telephone Encounter (Signed)
Attempted to reach pt x 3 for virtual PV with each going to VM.  LM for pt to call back by EOB to r/s PV to prevent cancellation of procedure.

## 2021-10-20 ENCOUNTER — Other Ambulatory Visit: Payer: Self-pay

## 2021-10-20 ENCOUNTER — Ambulatory Visit (AMBULATORY_SURGERY_CENTER): Payer: Self-pay

## 2021-10-20 VITALS — Ht 75.0 in | Wt 269.0 lb

## 2021-10-20 DIAGNOSIS — Z1211 Encounter for screening for malignant neoplasm of colon: Secondary | ICD-10-CM

## 2021-10-20 MED ORDER — NA SULFATE-K SULFATE-MG SULF 17.5-3.13-1.6 GM/177ML PO SOLN
1.0000 | Freq: Once | ORAL | 0 refills | Status: AC
Start: 2021-10-20 — End: 2021-10-20

## 2021-10-20 NOTE — Progress Notes (Signed)
Denies allergies to eggs or soy products. Denies complication of anesthesia or sedation. Denies use of weight loss medication. Denies use of O2.   Emmi instructions given for colonoscopy.  

## 2021-10-21 ENCOUNTER — Encounter: Payer: Self-pay | Admitting: Gastroenterology

## 2021-10-26 ENCOUNTER — Encounter: Payer: 59 | Admitting: Gastroenterology

## 2021-11-02 ENCOUNTER — Encounter: Payer: Self-pay | Admitting: Gastroenterology

## 2021-11-02 ENCOUNTER — Ambulatory Visit (AMBULATORY_SURGERY_CENTER): Payer: 59 | Admitting: Gastroenterology

## 2021-11-02 VITALS — BP 104/65 | HR 62 | Temp 98.3°F | Resp 14 | Ht 75.0 in | Wt 268.0 lb

## 2021-11-02 DIAGNOSIS — Z1211 Encounter for screening for malignant neoplasm of colon: Secondary | ICD-10-CM | POA: Diagnosis present

## 2021-11-02 MED ORDER — SODIUM CHLORIDE 0.9 % IV SOLN
500.0000 mL | Freq: Once | INTRAVENOUS | Status: DC
Start: 2021-11-02 — End: 2021-11-02

## 2021-11-02 NOTE — Progress Notes (Signed)
VS-CW  Pt's states no medical or surgical changes since previsit or office visit.  

## 2021-11-02 NOTE — Op Note (Signed)
[place] Patient Name: Jorge Morris Procedure Date: 11/02/2021 3:28 PM MRN: [id number] Endoscopist: Justice Britain , MD Age: 45 Referring MD:  Date of Birth: [birth year] Gender: Male Account #: [id number] Procedure:                Colonoscopy Indications:              Screening for colorectal malignant neoplasm, This                            is the patient's first colonoscopy Medicines:                Monitored Anesthesia Care Procedure:                Pre-Anesthesia Assessment:                           - Prior to the procedure, a History and Physical                            was performed, and patient medications and                            allergies were reviewed. The patient's tolerance of                            previous anesthesia was also reviewed. The risks                            and benefits of the procedure and the sedation                            options and risks were discussed with the patient.                            All questions were answered, and informed consent                            was obtained. Prior Anticoagulants: The patient has                            taken no previous anticoagulant or antiplatelet                            agents. ASA Grade Assessment: II - A patient with                            mild systemic disease. After reviewing the risks                            and benefits, the patient was deemed in                            satisfactory condition to undergo the procedure.  After obtaining informed consent, the colonoscope                            was passed under direct vision. Throughout the                            procedure, the patient's blood pressure, pulse, and                            oxygen saturations were monitored continuously. The                            Olympus CF-HQ190L ([id number]) Colonoscope was                            introduced through the  anus and advanced to the 5                            cm into the ileum. The colonoscopy was performed                            without difficulty. The patient tolerated the                            procedure. The quality of the bowel preparation was                            adequate. The terminal ileum, ileocecal valve,                            appendiceal orifice, and rectum were photographed. Scope In: 3:38:01 PM Scope Out: 3:53:16 PM Scope Withdrawal Time: 0 hours 10 minutes 43 seconds  Total Procedure Duration: 0 hours 15 minutes 15 seconds  Findings:                 The digital rectal exam findings include                            hemorrhoids. Pertinent negatives include no                            palpable rectal lesions.                           The terminal ileum and ileocecal valve appeared                            normal.                           Multiple small-mouthed diverticula were found in                            the recto-sigmoid colon and sigmoid colon.  Normal mucosa was found in the entire colon.                           Non-bleeding non-thrombosed internal hemorrhoids                            were found during retroflexion, during perianal                            exam and during digital exam. The hemorrhoids were                            Grade II (internal hemorrhoids that prolapse but                            reduce spontaneously). Complications:            No immediate complications. Estimated Blood Loss:     Estimated blood loss: none. Impression:               - Hemorrhoids found on digital rectal exam.                           - The examined portion of the ileum was normal.                           - Diverticulosis in the recto-sigmoid colon and in                            the sigmoid colon.                           - Normal mucosa in the entire examined colon.                           - Non-bleeding  non-thrombosed internal hemorrhoids. Recommendation:           - The patient will be observed post-procedure,                            until all discharge criteria are met.                           - Discharge patient to home.                           - Patient has a contact number available for                            emergencies. The signs and symptoms of potential                            delayed complications were discussed with the                            patient. Return to normal activities tomorrow.  Written discharge instructions were provided to the                            patient.                           - High fiber diet.                           - Use FiberCon 1-2 tablets PO daily.                           - Continue present medications.                           - Repeat colonoscopy in 10 years for screening                            purposes.                           - The findings and recommendations were discussed                            with the patient.                           - The findings and recommendations were discussed                            with the patient's family. Justice Britain, MD 11/02/2021 3:58:37 PM

## 2021-11-02 NOTE — Progress Notes (Signed)
Pt Drowsy. VSS. To PACU, report to RN. No anesthetic complications noted.  

## 2021-11-02 NOTE — Patient Instructions (Signed)
Handouts on high fiber diet, diverticulosis and hemorrhoids given to you today  Recommended to take Fibercon 1-2 tablets daily to keep bowel movements regular. Drink plenty of water    YOU HAD AN ENDOSCOPIC PROCEDURE TODAY AT THE [facility]:   Refer to the procedure report that was given to you for any specific questions about what was found during the examination.  If the procedure report does not answer your questions, please call your gastroenterologist to clarify.  If you requested that your care partner not be given the details of your procedure findings, then the procedure report has been included in a sealed envelope for you to review at your convenience later.  YOU SHOULD EXPECT: Some feelings of bloating in the abdomen. Passage of more gas than usual.  Walking can help get rid of the air that was put into your GI tract during the procedure and reduce the bloating. If you had a lower endoscopy (such as a colonoscopy or flexible sigmoidoscopy) you may notice spotting of blood in your stool or on the toilet paper. If you underwent a bowel prep for your procedure, you may not have a normal bowel movement for a few days.  Please Note:  You might notice some irritation and congestion in your nose or some drainage.  This is from the oxygen used during your procedure.  There is no need for concern and it should clear up in a day or so.  SYMPTOMS TO REPORT IMMEDIATELY:  Following lower endoscopy (colonoscopy or flexible sigmoidoscopy):  Excessive amounts of blood in the stool  Significant tenderness or worsening of abdominal pains  Swelling of the abdomen that is new, acute  Fever of 100F or higher  For urgent or emergent issues, a gastroenterologist can be reached at any hour by calling [phone]. Do not use MyChart messaging for urgent concerns.    DIET:  We do recommend a small meal at first, but then you may proceed to your regular diet.  Drink plenty of fluids but you  should avoid alcoholic beverages for 24 hours.  ACTIVITY:  You should plan to take it easy for the rest of today and you should NOT DRIVE or use heavy machinery until tomorrow (because of the sedation medicines used during the test).    FOLLOW UP: Our staff will call the number listed on your records 48-72 hours following your procedure to check on you and address any questions or concerns that you may have regarding the information given to you following your procedure. If we do not reach you, we will leave a message.  We will attempt to reach you two times.  During this call, we will ask if you have developed any symptoms of COVID 19. If you develop any symptoms (ie: fever, flu-like symptoms, shortness of breath, cough etc.) before then, please call [phone].  If you test positive for Covid 19 in the 2 weeks post procedure, please call and report this information to [place].    SIGNATURES/CONFIDENTIALITY: You and/or your care partner have signed paperwork which will be entered into your electronic medical record.  These signatures attest to the fact that that the information above on your After Visit Summary has been reviewed and is understood.  Full responsibility of the confidentiality of this discharge information lies with you and/or your care-partner.

## 2021-11-04 ENCOUNTER — Telehealth: Payer: Self-pay

## 2021-11-04 NOTE — Telephone Encounter (Signed)
  Follow up Call-  Call back number 11/02/2021  Post procedure Call Back phone  # [phone]  Permission to leave phone message Yes     Patient questions:  Do you have a fever, pain , or abdominal swelling? No. Pain Score  0 *  Have you tolerated food without any problems? Yes.    Have you been able to return to your normal activities? Yes.    Do you have any questions about your discharge instructions: Diet   No. Medications  No. Follow up visit  No.  Do you have questions or concerns about your Care? No.  Actions: * If pain score is 4 or above: No action needed, pain <4. Have you developed a fever since your procedure? no  2.   Have you had an respiratory symptoms (SOB or cough) since your procedure? no  3.   Have you tested positive for COVID 19 since your procedure no  4.   Have you had any family members/close contacts diagnosed with the COVID 19 since your procedure?  no   If yes to any of these questions please route to Laverna Peace, RN

## 2022-01-03 ENCOUNTER — Emergency Department (HOSPITAL_BASED_OUTPATIENT_CLINIC_OR_DEPARTMENT_OTHER)
Admission: EM | Admit: 2022-01-03 | Discharge: 2022-01-03 | Disposition: A | Discharge status: Home / Self Care | Payer: 59 | Attending: Emergency Medicine | Admitting: Emergency Medicine

## 2022-01-03 ENCOUNTER — Encounter: Payer: Self-pay | Admitting: Family Medicine

## 2022-01-03 ENCOUNTER — Other Ambulatory Visit: Payer: Self-pay

## 2022-01-03 ENCOUNTER — Emergency Department (HOSPITAL_BASED_OUTPATIENT_CLINIC_OR_DEPARTMENT_OTHER): Payer: 59

## 2022-01-03 ENCOUNTER — Encounter (HOSPITAL_BASED_OUTPATIENT_CLINIC_OR_DEPARTMENT_OTHER): Payer: Self-pay | Admitting: *Deleted

## 2022-01-03 ENCOUNTER — Ambulatory Visit: Payer: 59 | Admitting: Family Medicine

## 2022-01-03 VITALS — BP 138/90 | HR 81 | Temp 97.2°F | Ht 75.0 in | Wt 270.2 lb

## 2022-01-03 DIAGNOSIS — Z20822 Contact with and (suspected) exposure to covid-19: Secondary | ICD-10-CM | POA: Diagnosis not present

## 2022-01-03 DIAGNOSIS — R059 Cough, unspecified: Secondary | ICD-10-CM | POA: Diagnosis not present

## 2022-01-03 DIAGNOSIS — R062 Wheezing: Secondary | ICD-10-CM | POA: Insufficient documentation

## 2022-01-03 DIAGNOSIS — J4552 Severe persistent asthma with status asthmaticus: Secondary | ICD-10-CM

## 2022-01-03 DIAGNOSIS — R0602 Shortness of breath: Secondary | ICD-10-CM | POA: Diagnosis present

## 2022-01-03 LAB — RESP PANEL BY RT-PCR (FLU A&B, COVID) ARPGX2
Influenza A by PCR: NEGATIVE
Influenza B by PCR: NEGATIVE
SARS Coronavirus 2 by RT PCR: NEGATIVE

## 2022-01-03 MED ORDER — ALBUTEROL SULFATE (2.5 MG/3ML) 0.083% IN NEBU
2.5000 mg | INHALATION_SOLUTION | Freq: Once | RESPIRATORY_TRACT | Status: AC
  Administered 2022-01-03: 2.5 mg via RESPIRATORY_TRACT
  Filled 2022-01-03: qty 3

## 2022-01-03 MED ORDER — PREDNISONE 10 MG (21) PO TBPK: ORAL_TABLET | Freq: Every day | ORAL | 0 refills | Status: DC

## 2022-01-03 MED ORDER — ALBUTEROL SULFATE (2.5 MG/3ML) 0.083% IN NEBU
2.5000 mg | INHALATION_SOLUTION | Freq: Once | RESPIRATORY_TRACT | Status: AC
  Administered 2022-01-03: 2.5 mg via RESPIRATORY_TRACT

## 2022-01-03 MED ORDER — METHYLPREDNISOLONE SODIUM SUCC 125 MG IJ SOLR
125.0000 mg | Freq: Once | INTRAMUSCULAR | Status: AC
  Administered 2022-01-03: 125 mg via INTRAMUSCULAR

## 2022-01-03 MED ORDER — IPRATROPIUM BROMIDE 0.02 % IN SOLN
0.5000 mg | Freq: Once | RESPIRATORY_TRACT | Status: AC
  Administered 2022-01-03: 0.5 mg via RESPIRATORY_TRACT
  Filled 2022-01-03: qty 2.5

## 2022-01-03 MED ORDER — ALBUTEROL (5 MG/ML) CONTINUOUS INHALATION SOLN
10.0000 mg/h | INHALATION_SOLUTION | Freq: Once | RESPIRATORY_TRACT | Status: AC
  Administered 2022-01-03: 10 mg/h via RESPIRATORY_TRACT

## 2022-01-03 MED ORDER — IPRATROPIUM-ALBUTEROL 0.5-2.5 (3) MG/3ML IN SOLN
3.0000 mL | RESPIRATORY_TRACT | Status: AC
  Administered 2022-01-03: 3 mL via RESPIRATORY_TRACT
  Filled 2022-01-03: qty 3

## 2022-01-03 NOTE — ED Notes (Signed)
RN ambulated to room with pulse ox. SAT 93%

## 2022-01-03 NOTE — ED Provider Notes (Signed)
[facility] EMERGENCY DEPARTMENT Provider Note   CSN: [id number] Arrival date & time: 01/03/22  1756     History  Chief Complaint  Patient presents with   Shortness of Breath    Jorge Morris is a 46 y.o. male.   Shortness of Breath Associated symptoms: cough and wheezing   Associated symptoms: no abdominal pain, no chest pain, no fever, no headaches, no rash and no vomiting    Patient is a 46 year old gentleman presented emergency room today with complaints of shortness of breath and wheezing.  He was sent by his primary care provider who saw him this morning give him a dose of Solu-Medrol IM and a breathing treatment and sent him to the emergency room.  Seems that patient has had episodes of wheezing and shortness of breath consistent with asthma for the past few months.  He has twice been placed on steroids.  He states once he was a burst dose and once it was a tapered dose he states he had longer period of time without wheezing with the tapered dose.  He has been taking Xyzal, Singulair and using albuterol inhaler.  He has not a smoker.  No known family history of asthma.  States that he does not have any work exposure to dust, fumes, allergens.     Home Medications Prior to Admission medications   Medication Sig Start Date End Date Taking? Authorizing Provider  predniSONE (STERAPRED UNI-PAK 21 TAB) 10 MG (21) TBPK tablet Take by mouth daily. Take 6 tabs by mouth daily  for 2 days, then 5 tabs for 2 days, then 4 tabs for 2 days, then 3 tabs for 2 days, 2 tabs for 2 days, then 1 tab by mouth daily for 2 days 01/03/22  Yes Maliki Gignac S, PA  albuterol (VENTOLIN HFA) 108 (90 Base) MCG/ACT inhaler Inhale into the lungs. 10/28/21   [provider]  levocetirizine (XYZAL) 5 MG tablet Take 5 mg by mouth every evening.    [provider]  montelukast (SINGULAIR) 10 MG tablet Take 10 mg by mouth daily. 10/28/21   [provider]   Spacer/Aero-Holding Josiah Lobo (OPTICHAMBER DIAMOND) Many Every 4-6 Hours 11/29/21   [provider]      Allergies    Patient has no known allergies.    Review of Systems   Review of Systems  Constitutional:  Negative for chills and fever.  HENT:  Negative for congestion.   Eyes:  Negative for pain.  Respiratory:  Positive for cough, shortness of breath and wheezing.   Cardiovascular:  Negative for chest pain and leg swelling.  Gastrointestinal:  Negative for abdominal pain and vomiting.  Genitourinary:  Negative for dysuria.  Musculoskeletal:  Negative for myalgias.  Skin:  Negative for rash.  Neurological:  Negative for dizziness and headaches.   Physical Exam Updated Vital Signs BP (!) 155/97    Pulse 95    Temp 97.8 F (36.6 C) (Oral)    Resp (!) 23    Ht 6\' 3"  (1.905 m)    Wt 122.5 kg    SpO2 92%    BMI 33.75 kg/m  Physical Exam Vitals and nursing note reviewed.  Constitutional:      General: He is not in acute distress.    Comments: Pleasant 46 year old male nontoxic-appearing.  HENT:     Head: Normocephalic and atraumatic.     Nose: Nose normal.  Eyes:     General: No scleral icterus. Cardiovascular:  Rate and Rhythm: Normal rate and regular rhythm.     Pulses: Normal pulses.     Heart sounds: Normal heart sounds.  Pulmonary:     Effort: Pulmonary effort is normal. No respiratory distress.     Breath sounds: Wheezing present.     Comments: Coarse lung sounds and wheezing with inspiration and expiration.  No tachypnea.  Speaking in full sentences. Abdominal:     Palpations: Abdomen is soft.     Tenderness: There is no abdominal tenderness.  Musculoskeletal:     Cervical back: Normal range of motion.     Right lower leg: No edema.     Left lower leg: No edema.  Skin:    General: Skin is warm and dry.     Capillary Refill: Capillary refill takes less than 2 seconds.  Neurological:     Mental Status: He is alert. Mental status  is at baseline.  Psychiatric:        Mood and Affect: Mood normal.        Behavior: Behavior normal.    ED Results / Procedures / Treatments   Labs (all labs ordered are listed, but only abnormal results are displayed) Labs Reviewed  RESP PANEL BY RT-PCR (FLU A&B, COVID) [id number]    EKG EKG Interpretation  Date/Time:  Monday January 03 2022 19:49:33 EST Ventricular Rate:  70 PR Interval:  154 QRS Duration: 104 QT Interval:  400 QTC Calculation: 432 R Axis:   44 Text Interpretation: Sinus rhythm Confirmed by Madalyn Rob [phone]) on 01/03/2022 8:15:55 PM  Radiology DG Chest 2 View  Result Date: 01/03/2022 CLINICAL DATA:  Shortness of breath. EXAM: CHEST - 2 VIEW COMPARISON:  None. FINDINGS: The heart size and mediastinal contours are within normal limits. Both lungs are clear. The visualized skeletal structures are unremarkable. IMPRESSION: No active cardiopulmonary disease. Electronically Signed   By: Virgina Norfolk M.D.   On: 01/03/2022 19:50    Procedures Procedures    Medications Ordered in ED Medications  ipratropium-albuterol (DUONEB) 0.5-2.5 (3) MG/3ML nebulizer solution 3 mL (3 mLs Nebulization Given 01/03/22 1951)  albuterol (PROVENTIL) (2.5 MG/3ML) 0.083% nebulizer solution 2.5 mg (2.5 mg Nebulization Given 01/03/22 1951)  albuterol (PROVENTIL,VENTOLIN) solution continuous neb (10 mg/hr Nebulization Given 01/03/22 2030)  ipratropium (ATROVENT) nebulizer solution 0.5 mg (0.5 mg Nebulization Given 01/03/22 2030)    ED Course/ Medical Decision Making/ A&P Clinical Course as of 01/04/22 0008  Mon Jan 03, 2022  2012 Taper steroids [WF]  2045 On CAT currently.  Will reassess after 1 hour [WF]    Clinical Course User Index [WF] Tedd Sias, PA                           Medical Decision Making  Patient is a 46 year old gentleman presented emergency room today with complaints of shortness of breath and wheezing.  He was sent by his primary care provider who  saw him this morning give him a dose of Solu-Medrol IM and a breathing treatment and sent him to the emergency room.  Seems that patient has had episodes of wheezing and shortness of breath consistent with asthma for the past few months.  He has twice been placed on steroids.  He states once he was a burst dose and once it was a tapered dose he states he had longer period of time without wheezing with the tapered dose.  He has been taking Xyzal, Singulair and using albuterol inhaler.  He has not a smoker.  No known family history of asthma.  States that he does not have any work exposure to dust, fumes, allergens.  ED EKG nonischemic.  Chest x-ray without any infiltrate or evidence of pneumonia.  COVID influenza negative.  Patient has already received IM Solu-Medrol  Physical exam notable for expiratory expiratory wheezing.  He is not particularly tachypneic but does seem somewhat uncomfortable.  Will provide patient with breathing treatment and reassess.  Patient given duo nebs and continuous albuterol therapy for less than 1 hour.  On my reassessment patient seems to be moving air much better.  Wheezing is now mostly and expiratory.  He is satting 94% on room air.  He was ambulated and dropped to 89% briefly but states that he does not feel particularly symptomatic and feels much improved.  Offered mission the patient who declined.  Discussed my attending physician who is agreeable with my plan to discharge home with prednisone will prescribe taper.  Patient will need to follow-up with pulmonology given information for 2 offices.  Strict return precautions given.  Final Clinical Impression(s) / ED Diagnoses Final diagnoses:  Wheezing    Rx / DC Orders ED Discharge Orders          Ordered    predniSONE (STERAPRED UNI-PAK 21 TAB) 10 MG (21) TBPK tablet  Daily        01/03/22 2219              Tedd Sias, [place] 01/04/22 0011    Lucrezia Starch, MD 01/06/22 1606

## 2022-01-03 NOTE — ED Notes (Signed)
Ambulated patient per order. Patient resting oxygen saturation 91%/HR 103.Patient oxygen saturation did fall to 89%/HR 106. Patient returned to room and placed back on monitor.  Patient walked with a normal gait speaking in complete sentences. Patient complained that while his breathing had improved he still felt tightness in his chest. Physician notified. Patient tolerated well.

## 2022-01-03 NOTE — Progress Notes (Signed)
Established Patient Office Visit  Subjective:  Patient ID: Jorge Morris, male    DOB: 07-30-1976  Age: 46 y.o. MRN: [id number]  CC:  Chief Complaint  Patient presents with   Cough    Cough, SOB, little mucus. Has same symptoms 4 months ago antibiotics helped but have come back.     HPI Jorge Morris presents for evaluation of a 1 week history of increasing wheezing with shortness of breath.  He had been seen this past month twice in an acute care clinic and treated with steroids, zyzal, Singulair and albuterol.  He had done well with this combination until he ran out of the Xyzal and Singulair.  He had only taken prednisone for a week about a month ago.  He has no asthma history.  He does not smoke.  There is no family history of asthma.  Knows of no changes at home or work.  Symptoms improved when he went up to [employer] [employer] [place] for business.  There are no pets in the house.  History of mild seasonal allergies. He is generally healthy.   Past Medical History:  Diagnosis Date   Allergy     Past Surgical History:  Procedure Laterality Date   TESTICLE SURGERY     VASECTOMY  2013    Family History  Problem Relation Age of Onset   Cancer Mother    Healthy Father    Cancer Maternal Grandmother    Cancer Maternal Grandfather    Colon cancer Neg Hx    Esophageal cancer Neg Hx    Rectal cancer Neg Hx    Stomach cancer Neg Hx     Social History   Socioeconomic History   Marital status: Married    Spouse name: Not on file   Number of children: Not on file   Years of education: Not on file   Highest education level: Not on file  Occupational History   Not on file  Tobacco Use   Smoking status: Never   Smokeless tobacco: Never  Vaping Use   Vaping Use: Never used  Substance and Sexual Activity   Alcohol use: Yes    Comment: socially   Drug use: Never   Sexual activity: Yes  Other Topics Concern   Not on file  Social History Narrative   Not on file   Social Determinants  of Health   Financial Resource Strain: Not on file  Food Insecurity: Not on file  Transportation Needs: Not on file  Physical Activity: Not on file  Stress: Not on file  Social Connections: Not on file  Intimate Partner Violence: Not on file    Outpatient Medications Prior to Visit  Medication Sig Dispense Refill   albuterol (VENTOLIN HFA) 108 (90 Base) MCG/ACT inhaler Inhale into the lungs.     levocetirizine (XYZAL) 5 MG tablet Take 5 mg by mouth every evening.     montelukast (SINGULAIR) 10 MG tablet Take 10 mg by mouth daily.     Spacer/Aero-Holding Chambers (OPTICHAMBER DIAMOND) MISC SMARTSIG:Via Inhaler Every 4-6 Hours     doxycycline (VIBRAMYCIN) 100 MG capsule Take 100 mg by mouth 2 (two) times daily.     No facility-administered medications prior to visit.    No Known Allergies  ROS Review of Systems  Constitutional:  Negative for diaphoresis, fatigue, fever and unexpected weight change.  Eyes:  Negative for photophobia and visual disturbance.  Respiratory:  Positive for chest tightness, shortness of breath and wheezing.   Cardiovascular:  Negative for  chest pain and palpitations.  Gastrointestinal: Negative.   Genitourinary: Negative.   Neurological:  Negative for speech difficulty and weakness.     Objective:    Physical Exam Vitals and nursing note reviewed.  Constitutional:      General: He is not in acute distress.    Appearance: Normal appearance. He is not ill-appearing, toxic-appearing or diaphoretic.  HENT:     Head: Normocephalic and atraumatic.     Right Ear: External ear normal.     Left Ear: External ear normal.     Mouth/Throat:     Mouth: Mucous membranes are moist.     Pharynx: Oropharynx is clear. No oropharyngeal exudate or posterior oropharyngeal erythema.  Neck:     Vascular: No carotid bruit.  Cardiovascular:     Rate and Rhythm: Normal rate and regular rhythm.  Pulmonary:     Breath sounds: Decreased air movement present. No  stridor. Examination of the right-upper field reveals wheezing. Examination of the left-upper field reveals wheezing. Examination of the right-middle field reveals wheezing. Examination of the left-middle field reveals wheezing. Examination of the right-lower field reveals wheezing. Examination of the left-lower field reveals wheezing. Wheezing present.  Abdominal:     General: Bowel sounds are normal.  Musculoskeletal:     Cervical back: No rigidity or tenderness.  Lymphadenopathy:     Cervical: No cervical adenopathy.  Skin:    General: Skin is warm and dry.  Neurological:     Mental Status: He is alert and oriented to person, place, and time.  Psychiatric:        Mood and Affect: Mood normal.        Behavior: Behavior normal.    BP 138/90 (BP Location: Left Arm, Patient Position: Sitting, Cuff Size: Large)    Pulse 81    Temp (!) 97.2 F (36.2 C) (Temporal)    Ht 6\' 3"  (1.905 m)    Wt 270 lb 3.2 oz (122.6 kg)    SpO2 93%    BMI 33.77 kg/m  Wt Readings from Last 3 Encounters:  01/03/22 270 lb 3.2 oz (122.6 kg)  11/02/21 268 lb (121.6 kg)  10/20/21 269 lb (122 kg)     There are no preventive care reminders to display for this patient.  There are no preventive care reminders to display for this patient.  No results found for: TSH Lab Results  Component Value Date   WBC 4.7 08/27/2021   HGB 16.4 08/27/2021   HCT 48.7 08/27/2021   MCV 92.3 08/27/2021   PLT 219.0 08/27/2021   Lab Results  Component Value Date   NA 139 08/27/2021   K 4.4 08/27/2021   CO2 27 08/27/2021   GLUCOSE 89 08/27/2021   BUN 18 08/27/2021   CREATININE 1.22 08/27/2021   BILITOT 0.7 08/27/2021   ALKPHOS 64 08/27/2021   AST 23 08/27/2021   ALT 47 08/27/2021   PROT 6.6 08/27/2021   ALBUMIN 4.3 08/27/2021   CALCIUM 9.3 08/27/2021   GFR 71.02 08/27/2021   Lab Results  Component Value Date   CHOL 193 08/27/2021   Lab Results  Component Value Date   HDL 32.90 (L) 08/27/2021   Lab Results   Component Value Date   LDLCALC 138 (H) 08/27/2021   Lab Results  Component Value Date   TRIG 114.0 08/27/2021   Lab Results  Component Value Date   CHOLHDL 6 08/27/2021   No results found for: HGBA1C    Assessment & Plan:  Problem List Items Addressed This Visit       Respiratory   Severe persistent asthma with status asthmaticus - Primary   Relevant Medications   methylPREDNISolone sodium succinate (SOLU-MEDROL) 125 mg/2 mL injection 125 mg   albuterol (PROVENTIL) (2.5 MG/3ML) 0.083% nebulizer solution 2.5 mg   Other Relevant Orders   Peak flow meter    Meds ordered this encounter  Medications   methylPREDNISolone sodium succinate (SOLU-MEDROL) 125 mg/2 mL injection 125 mg   albuterol (PROVENTIL) (2.5 MG/3ML) 0.083% nebulizer solution 2.5 mg    Follow-up: No follow-ups on file.   To ER now. Spoke with ER doc. Post neb PF was 300.  Libby Maw, MD

## 2022-01-03 NOTE — Discharge Instructions (Signed)
Please call tomorrow to make an appointment with the pulmonologist.  Have given you the information for 2 different pulmonologist.  1 [place] [facility] for you.  Please return to the emergency room should you experience any worsening in your ability to breathe.  Or any other new or concerning symptoms.  Please take the tapered prednisone prescription that I have prescribed you for the entire course.  Please continue to refrain from smoking or any smoke exposure.  Continue to use your albuterol inhaler as needed that you are prescribed with the spacer.  I also recommend that you continue to take the Xyzal and Singulair that you are prescribed.

## 2022-01-03 NOTE — ED Triage Notes (Signed)
Was seen at his Primary Care MD today for Shortness of Breath, was given steroids and HHN tx. States he feels much better, speech WNL. POX on RA @ 93%, HR remained at 74/min.

## 2022-01-05 NOTE — Progress Notes (Signed)
01/06/22- 46 yoM never smoker.  Pulmonary evaluation courtesy of Solon [place], [place] at [facility] with concern of Asthma. Medical problem list includes severe persistent Asthma, Osteoarthritis, Elevated Liver Enzyme, Hyperlipidemia, Overweight -Ventolin HFA/spacer tube, Singulair, Xyzal 5 mg, prednisone taper(1/16) Covid vax-2 Phizer Flu vax-had -----Patient started wheezing in November and been taking prednisone since then and has told that he now has asthma. Patient has shortness of breath and cough with exertion. Still wheezing worse at night. Having trouble keeping his pulse ox up when he's not on prednisone.  ED 1/16 gave solumedrol and neb treatment. Wheezing began with a cold in September.  About the same time his office had HVAC renovation done "right over my head".  No apparent generation of dust or obvious respiratory irritants.  His office is now cooler than before.  Wife had put air cleaner and coolmist humidifier in his bedroom and he thinks he slept a little better with that.  Symptoms resolved completely for 1 week when he went to [place] to work but resumed as he came back.  He has not noticed if he is better on weekends away from his office.  He felt well on prednisone after November 10 visit to urgent care but symptoms again returned when prednisone was stopped.  Went back to urgent care mid December.  Wheeze seemed to increase awareness of reflux.  He has continued albuterol rescue inhaler every 6 hours through a spacer tube with some benefit.  No prior history of asthma.  Ran cross-country in college.  He has had minimal rhinitis symptoms.  Uses wood-burning fireplace at home only [place] and he says it is well vented.  He has several days left on prednisone taper started recently.  No prior history of asthma or significant respiratory problems.  Never a very atopic person. CXR 01/03/22 IMPRESSION: No active cardiopulmonary disease.  Prior to Admission medications    Medication Sig Start Date End Date Taking? Authorizing Provider  albuterol (VENTOLIN HFA) 108 (90 Base) MCG/ACT inhaler Inhale into the lungs. 10/28/21  Yes [provider]  Fluticasone-Umeclidin-Vilant (TRELEGY ELLIPTA) 200-62.5-25 MCG/ACT AEPB Inhale 1 puff into the lungs daily. 01/06/22  Yes Casper Pagliuca, Joni Fears D, MD  levocetirizine (XYZAL) 5 MG tablet Take 5 mg by mouth every evening.   Yes [provider]  montelukast (SINGULAIR) 10 MG tablet Take 10 mg by mouth daily. 10/28/21  Yes [provider]  predniSONE (STERAPRED UNI-PAK 21 TAB) 10 MG (21) TBPK tablet Take by mouth daily. Take 6 tabs by mouth daily  for 2 days, then 5 tabs for 2 days, then 4 tabs for 2 days, then 3 tabs for 2 days, 2 tabs for 2 days, then 1 tab by mouth daily for 2 days 01/03/22  Yes Fondaw, Rodrigo Ran, PA  Spacer/Aero-Holding Chambers (OPTICHAMBER DIAMOND) MISC SMARTSIG:Via Inhaler Every 4-6 Hours 11/29/21  Yes [provider]   Past Medical History:  Diagnosis Date   Allergy    Past Surgical History:  Procedure Laterality Date   TESTICLE SURGERY     VASECTOMY  2013   Family History  Problem Relation Age of Onset   Cancer Mother    Healthy Father    Cancer Maternal Grandmother    Cancer Maternal Grandfather    Colon cancer Neg Hx    Esophageal cancer Neg Hx    Rectal cancer Neg Hx    Stomach cancer Neg Hx    Social History   Socioeconomic History   Marital status: Married  Spouse name: Not on file   Number of children: Not on file   Years of education: Not on file   Highest education level: Not on file  Occupational History   Not on file  Tobacco Use   Smoking status: Never   Smokeless tobacco: Never  Vaping Use   Vaping Use: Never used  Substance and Sexual Activity   Alcohol use: Yes    Comment: socially   Drug use: Never   Sexual activity: Yes  Other Topics Concern   Not on file  Social History Narrative   Not on file   Social Determinants of Health    Financial Resource Strain: Not on file  Food Insecurity: Not on file  Transportation Needs: Not on file  Physical Activity: Not on file  Stress: Not on file  Social Connections: Not on file  Intimate Partner Violence: Not on file   ROS-see HPI   + = positive Constitutional:    weight loss, night sweats, fevers, chills, fatigue, lassitude. HEENT:    headaches, difficulty swallowing, tooth/dental problems, +sore throat,       +sneezing, itching, ear ache, +nasal congestion, post nasal drip, snoring CV:    +chest pain, orthopnea, PND, swelling in lower extremities, anasarca,                     dizziness, palpitations Resp:   +shortness of breath with exertion or at rest.                +productive cough,  + non-productive cough, coughing up of blood.              change in color of mucus.  +wheezing.   Skin:    rash or lesions. GI:  No-   heartburn, +indigestion, abdominal pain, nausea, vomiting, diarrhea,                 change in bowel habits, loss of appetite GU: dysuria, change in color of urine, no urgency or frequency.   flank pain. MS:   joint pain, stiffness, decreased range of motion, back pain. Neuro-     nothing unusual Psych:  change in mood or affect.  depression or anxiety.   memory loss.  OBJ- Physical Exam General- Alert, Oriented, Affect-appropriate, Distress- none acute Skin- rash-none, lesions- none, excoriation- none Lymphadenopathy- none Head- atraumatic            Eyes- Gross vision intact, PERRLA, conjunctivae and secretions clear            Ears- Hearing, canals-normal            Nose- Clear, no-Septal dev, mucus, polyps, erosion, perforation             Throat- Mallampati II , mucosa clear , drainage- none, tonsils- atrophic Neck- flexible , trachea midline, no stridor , thyroid nl, carotid no bruit Chest - symmetrical excursion , unlabored           Heart/CV- RRR , no murmur , no gallop  , no rub, nl s1 s2                           - JVD- none , edema-  none, stasis changes- none, varices- none           Lung- clear to P&A, wheeze+, cough+dry , dullness-none, rub- none           Chest wall-  Abd-  Br/  Gen/ Rectal- Not done, not indicated Extrem- cyanosis- none, clubbing, none, atrophy- none, strength- nl Neuro- grossly intact to observation

## 2022-01-06 ENCOUNTER — Encounter: Payer: Self-pay | Admitting: Internal Medicine

## 2022-01-06 ENCOUNTER — Other Ambulatory Visit: Payer: Self-pay

## 2022-01-06 ENCOUNTER — Ambulatory Visit: Payer: 59 | Admitting: Internal Medicine

## 2022-01-06 DIAGNOSIS — R748 Abnormal levels of other serum enzymes: Secondary | ICD-10-CM | POA: Diagnosis not present

## 2022-01-06 DIAGNOSIS — J4552 Severe persistent asthma with status asthmaticus: Secondary | ICD-10-CM

## 2022-01-06 MED ORDER — TRELEGY ELLIPTA 200-62.5-25 MCG/ACT IN AEPB: 1.0000 | INHALATION_SPRAY | Freq: Every day | RESPIRATORY_TRACT | 0 refills | Status: DC

## 2022-01-06 NOTE — Patient Instructions (Signed)
Order- sample x 2 Trelegy 200 inhaler  Inhale 1 puff then rinse mouth well/ brush teeth, once daily  Finish the prednisone taper  Ok to use the albuterol rescue inhaler every 4-6 hours, as needed.  Please call if we can help

## 2022-01-07 ENCOUNTER — Encounter: Payer: Self-pay | Admitting: Internal Medicine

## 2022-01-07 NOTE — Assessment & Plan Note (Signed)
Adult onset asthma, new to him.  It sounds as if it may have begun with a viral type respiratory infection and/or exposure to dust from HVAC system under renovation.  Watch for changes of hypersensitivity pneumonia. Plan-finish current prednisone taper, add samples of Trelegy 200 with discussion, continue rescue inhaler when needed, schedule PFT.  Anticipate lab work including CBC with differential, IgE level when off of prednisone-Next visit

## 2022-01-07 NOTE — Assessment & Plan Note (Signed)
Do not anticipate interference with normal medication metabolism.

## 2022-01-27 ENCOUNTER — Telehealth: Payer: Self-pay | Admitting: Family Medicine

## 2022-01-27 NOTE — Telephone Encounter (Signed)
Pt was referred to a pulmonologist and an allergist by another provider. He really wants Dr. Evangeline Gula opinion if he needs these two appointments.

## 2022-01-27 NOTE — Telephone Encounter (Signed)
Please advise message below  °

## 2022-01-28 NOTE — Telephone Encounter (Signed)
Per patient he will keep the appointment with allergist and go from there.

## 2022-02-02 ENCOUNTER — Telehealth: Payer: Self-pay | Admitting: Internal Medicine

## 2022-02-04 MED ORDER — TRELEGY ELLIPTA 200-62.5-25 MCG/ACT IN AEPB: 1.0000 | INHALATION_SPRAY | Freq: Every day | RESPIRATORY_TRACT | 5 refills | Status: DC

## 2022-02-04 NOTE — Telephone Encounter (Signed)
If pt liked the Trelegy sample, we can send a prescription to the pharmacy for pt.  Attempted to call pt but unable to reach. Left message for him to return call.

## 2022-02-04 NOTE — Telephone Encounter (Signed)
Patient would like Trelegy called into pharmacy. Pharmacy is [facility]. Patient phone number is [phone].

## 2022-02-04 NOTE — Telephone Encounter (Signed)
Called and spoke with patient. He stated that the Trelegy has worked well for him. He would like to have the RX sent to [facility]. RX has been sent.   Nothing further needed at time of call.

## 2022-02-04 NOTE — Progress Notes (Unsigned)
01/06/22- 46 yoM never smoker.  Pulmonary evaluation courtesy of Solon [place], [place] at [facility] with concern of Asthma. Medical problem list includes severe persistent Asthma, Osteoarthritis, Elevated Liver Enzyme, Hyperlipidemia, Overweight -Ventolin HFA/spacer tube, Singulair, Xyzal 5 mg, prednisone taper(1/16) Covid vax-2 Phizer Flu vax-had -----Patient started wheezing in November and been taking prednisone since then and has told that he now has asthma. Patient has shortness of breath and cough with exertion. Still wheezing worse at night. Having trouble keeping his pulse ox up when he's not on prednisone.  ED 1/16 gave solumedrol and neb treatment. Wheezing began with a cold in September.  About the same time his office had HVAC renovation done "right over my head".  No apparent generation of dust or obvious respiratory irritants.  His office is now cooler than before.  Wife had put air cleaner and coolmist humidifier in his bedroom and he thinks he slept a little better with that.  Symptoms resolved completely for 1 week when he went to [place] to work but resumed as he came back.  He has not noticed if he is better on weekends away from his office.  He felt well on prednisone after November 10 visit to urgent care but symptoms again returned when prednisone was stopped.  Went back to urgent care mid December.  Wheeze seemed to increase awareness of reflux.  He has continued albuterol rescue inhaler every 6 hours through a spacer tube with some benefit.  No prior history of asthma.  Ran cross-country in college.  He has had minimal rhinitis symptoms.  uses wood-burning fireplace at home only [place] and he says it is well vented.  He has several days left on prednisone taper started recently.  No prior history of asthma or significant respiratory problems.  Never a very atopic person. CXR 01/03/22 IMPRESSION: No active cardiopulmonary disease.  02/07/22- 46 yoM  never smoker.   Followed for  severe persistent Asthma, complicated by Osteoarthritis, Elevated Liver Enzyme, Hyperlipidemia, Overweight -Ventolin HFA/spacer tube, Singulair, Xyzal 5 mg, Trelegy 200,  Covid vax-2 Phizer Flu vax-had  ROS-see HPI   + = positive Constitutional:    weight loss, night sweats, fevers, chills, fatigue, lassitude. HEENT:    headaches, difficulty swallowing, tooth/dental problems, +sore throat,       +sneezing, itching, ear ache, +nasal congestion, post nasal drip, snoring CV:    +chest pain, orthopnea, PND, swelling in lower extremities, anasarca,                     dizziness, palpitations Resp:   +shortness of breath with exertion or at rest.                +productive cough,  + non-productive cough, coughing up of blood.              change in color of mucus.  +wheezing.   Skin:    rash or lesions. GI:  No-   heartburn, +indigestion, abdominal pain, nausea, vomiting, diarrhea,                 change in bowel habits, loss of appetite GU: dysuria, change in color of urine, no urgency or frequency.   flank pain. MS:   joint pain, stiffness, decreased range of motion, back pain. Neuro-     nothing unusual Psych:  change in mood or affect.  depression or anxiety.   memory loss.  OBJ- Physical Exam General- Alert, Oriented, Affect-appropriate, Distress- none acute  Skin- rash-none, lesions- none, excoriation- none Lymphadenopathy- none Head- atraumatic            Eyes- Gross vision intact, PERRLA, conjunctivae and secretions clear            Ears- Hearing, canals-normal            Nose- Clear, no-Septal dev, mucus, polyps, erosion, perforation             Throat- Mallampati II , mucosa clear , drainage- none, tonsils- atrophic Neck- flexible , trachea midline, no stridor , thyroid nl, carotid no bruit Chest - symmetrical excursion , unlabored           Heart/CV- RRR , no murmur , no gallop  , no rub, nl s1 s2                           - JVD- none , edema- none, stasis changes-  none, varices- none           Lung- clear to P&A, wheeze+, cough+dry , dullness-none, rub- none           Chest wall-  Abd-  Br/ Gen/ Rectal- Not done, not indicated Extrem- cyanosis- none, clubbing, none, atrophy- none, strength- nl Neuro- grossly intact to observation

## 2022-02-07 ENCOUNTER — Ambulatory Visit: Payer: 59 | Admitting: Internal Medicine

## 2022-02-10 ENCOUNTER — Other Ambulatory Visit: Payer: Self-pay

## 2022-02-10 ENCOUNTER — Ambulatory Visit: Payer: 59 | Admitting: Allergy

## 2022-02-10 ENCOUNTER — Encounter: Payer: Self-pay | Admitting: Allergy

## 2022-02-10 VITALS — BP 140/72 | HR 78 | Temp 98.0°F | Resp 16 | Ht 75.0 in | Wt 272.1 lb

## 2022-02-10 DIAGNOSIS — J4550 Severe persistent asthma, uncomplicated: Secondary | ICD-10-CM

## 2022-02-10 NOTE — Patient Instructions (Signed)
-   Allergy testing today showed: oak tree, grasses, ragweed, indoor molds, outdoor molds, dust mites, cat, and dog. - Copy of test results provided.  - Avoidance measures provided. - Continue with:  -Trelegy 1 puff daily.  -Have access to albuterol inhaler 2 puffs every 4-6 hours as needed for cough/wheeze/shortness of breath/chest tightness.  May use 15-20 minutes prior to activity.   Monitor frequency of use.   - If needed can take:   - long acting antihistamine like Allegra 180mg  daily as needed for any allergy symptoms like itchy/watery eyes, runny/stuffy nose, sneezing, itching etc.  If this is not effective let know and can recommend a prescription based antihistamine  Follow-up in 6-12 months or sooner if needed

## 2022-02-10 NOTE — Progress Notes (Signed)
New Patient Note  RE: Jorge Morris MRN: [id number] DOB: 08/06/1976 Date of Office Visit: 02/10/2022  Referring provider: Mliss Sax,* Primary care provider: Mliss Sax, MD  Chief Complaint: allergy testing  History of present illness: Jorge Morris is a 46 y.o. male presenting today for evaluation of allergy testing.   He states he was having cough and wheeze symptoms back in September and he did see an urgent care for these symptoms.  He states his oxygen was down to 92%.   He recalls taking prednisone burst then.   He continue to have issue with cough and wheezing.  These symptoms landed him in the ED on 01/03/2022 after he was sent from his PCPs office due to symptoms.  In the ED he was noted to have bilateral wheezing.  He has been on and off steroids over the past couple of months for an asthma diagnosis.  He was noted to be borderline hypoxic in the low 90% at times.  He had a chest x-ray that was unremarkable.  He was treated with DuoNeb and was on continuous albuterol for about an hour.  he was discharged with a prednisone taper.  It was also recommended that he follow-up with pulmonology. He then saw Dr. Maple Hudson in pulmonology on 01/06/2022.  It was thought that this new onset asthma was triggered by viral infection and/or exposure from does from the HVAC system that was under elevation.  Thus there is concern for possible hypersensitivity pneumonia.  Dr. Maple Hudson recommended that he try Trelegy 200 mcg daily dosing.  He does have access to an albuterol inhaler.  He was also recommended he have PFTs done. Per patient it was thought alongside his PCP that he should have allergy testing done to see if he potentially has mold or dust mite or any other aeroallergens that might also trigger respiratory symptoms.   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  He states other people who work in the office also  were noting issues as well.   He also states right before symptom onset he could hear repair people doing work on [employer].  He states there were several days where office was cold and then warm with the repairs.   He also states in his home they have had issues with mold.  They are running air purifiers in the bedroom.     They have a wood burning fireplace and this year they did burn "green" fresh wood (they usually would burn dried wood) he states that they were using were oak.   He states the trelegy has been working well and he is not having the cough or wheeze symptoms while taking it.  He also states he has had bad indigestion during this whole time and is taking a PPI.    He had been taking Xyzal (makes him drowsy) and Singulair (he ran out of this).   He didn't see any real benefit with these medications.     Review of systems: Review of Systems  Constitutional: Negative.   HENT: Negative.    Eyes: Negative.   Respiratory: Negative.    Cardiovascular: Negative.   Musculoskeletal: Negative.   Skin: Negative.   Allergic/Immunologic: Negative.   Neurological: Negative.    All other systems negative unless noted above in HPI  Past medical history: Past Medical History:  Diagnosis Date   Allergy    Asthma    Elevated liver  enzymes    Hyperlipidemia    Osteoarthritis     Past surgical history: Past Surgical History:  Procedure Laterality Date   TESTICLE SURGERY     VASECTOMY  2013    Family history:  Family History  Problem Relation Age of Onset   Cancer Mother    Healthy Father    Cancer Maternal Grandmother    Cancer Maternal Grandfather    Colon cancer Neg Hx    Esophageal cancer Neg Hx    Rectal cancer Neg Hx    Stomach cancer Neg Hx     Social history:  Lives in a home without carpeting with gas heating and central cooling.  No pets in the home.  There is no concern for roaches in the home.  There is concern for water damage, mildew in the home.  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  He denies a smoking history.  Medication List: Current Outpatient Medications  Medication Sig Dispense Refill   albuterol (VENTOLIN HFA) 108 (90 Base) MCG/ACT inhaler Inhale into the lungs.     Fluticasone-Umeclidin-Vilant (TRELEGY ELLIPTA) 200-62.5-25 MCG/ACT AEPB Inhale 1 puff into the lungs daily. 60 each 5   levocetirizine (XYZAL) 5 MG tablet Take 5 mg by mouth every evening.     Spacer/Aero-Holding Chambers (OPTICHAMBER DIAMOND) MISC SMARTSIG:Via Inhaler Every 4-6 Hours     montelukast (SINGULAIR) 10 MG tablet Take 10 mg by mouth daily. (Patient not taking: Reported on 02/10/2022)     No current facility-administered medications for this visit.    Known medication allergies: No Known Allergies   Physical examination: Blood pressure 140/72, pulse 78, temperature 98 F (36.7 C), resp. rate 16, height 6\' 3"  (1.905 m), weight 272 lb 2 oz (123.4 kg), SpO2 97 %.  General: Alert, interactive, in no acute distress. HEENT: PERRLA, TMs pearly gray, turbinates non-edematous without discharge, post-pharynx non erythematous. Neck: Supple without lymphadenopathy. Lungs: Clear to auscultation without wheezing, rhonchi or rales. {no increased work of breathing. CV: Normal S1, S2 without murmurs. Abdomen: Nondistended, nontender. Skin: Warm and dry, without lesions or rashes. Extremities:  No clubbing, cyanosis or edema. Neuro:   Grossly intact.  Diagnositics/Labs:  Spirometry: FEV1: 4.17L 89%, FVC: 7.0L 117%, ratio consistent with nonobstructive  Allergy testing:   Airborne Adult Perc - 02/10/22 1450     Time Antigen Placed 1450    Allergen Manufacturer 02/12/22    Location Back    Number of Test 59    1. Control-Buffer 50% Glycerol Negative    2. Control-Histamine 1 mg/ml 2+    3. Albumin saline Negative    4. Bahia Negative    5. Waynette Buttery Negative    6. Johnson Negative    7. Kentucky Blue Negative    8. Meadow Fescue Negative    9. Perennial  Rye Negative    10. Sweet Vernal Negative    11. Timothy Negative    12. Cocklebur Negative    13. Burweed Marshelder Negative    14. Ragweed, short Negative    15. Ragweed, Giant Negative    16. Plantain,  English Negative    17. Lamb's Quarters Negative    18. Sheep Sorrell Negative    19. Rough Pigweed Negative    20. Marsh Elder, Rough Negative    21. Mugwort, Common Negative    22. Ash mix Negative    23. Birch mix Negative    24. Beech American Negative    25. Box, Elder Negative  26. Cedar, red Negative    27. Cottonwood, Guinea-Bissau Negative    28. Elm mix Negative    29. Hickory Negative    30. Maple mix Negative    31. Oak, [place] mix 2+    32. Pecan Pollen Negative    33. Pine mix Negative    34. Sycamore Eastern Negative    35. Walnut, Black Pollen Negative    36. Alternaria alternata Negative    37. Cladosporium Herbarum 2+    38. Aspergillus mix Negative    39. Penicillium mix Negative    40. Bipolaris sorokiniana (Helminthosporium) Negative    41. Drechslera spicifera (Curvularia) Negative    42. Mucor plumbeus Negative    43. Fusarium moniliforme 2+    44. Aureobasidium pullulans (pullulara) 2+    45. Rhizopus oryzae Negative    46. Botrytis cinera Negative    47. Epicoccum nigrum Negative    48. Phoma betae Negative    49. Candida Albicans Negative    50. Trichophyton mentagrophytes 3+    51. Mite, D Farinae  5,000 AU/ml Negative    52. Mite, D Pteronyssinus  5,000 AU/ml Negative    53. Cat Hair 10,000 BAU/ml Negative    54.  Dog Epithelia Negative    55. Mixed Feathers Negative    56. Horse Epithelia Negative    57. Cockroach, German Negative    58. Mouse Negative    59. Tobacco Leaf Negative             Intradermal - 02/10/22 1615     Time Antigen Placed 1615    Allergen Manufacturer Waynette Buttery    Location Arm    Number of Test 12    Control Negative    [place] Territ[place] Negative    Johnson Negative    7 Grass 2+    Ragweed mix 2+    Weed mix  Negative    Mold 2 Negative    Mold 3 Negative    Cat 4+    Dog 2+    Cockroach Negative    Mite mix 3+             Allergy testing results were read and interpreted by provider, documented by clinical staff.   Assessment and plan: Severe persistent asthma - Likely allergen driven component to cough and wheeze symptoms  - Allergy testing today showed: oak tree, grasses, ragweed, indoor molds, outdoor molds, dust mites, cat, and dog. - Copy of test results provided.  - Avoidance measures provided. - Continue with:  -Trelegy 1 puff daily.  -Have access to albuterol inhaler 2 puffs every 4-6 hours as needed for cough/wheeze/shortness of breath/chest tightness.  May use 15-20 minutes prior to activity.   Monitor frequency of use.   - If needed can take:   - long acting antihistamine like Allegra 180mg  daily as needed for any allergy symptoms like itchy/watery eyes, runny/stuffy nose, sneezing, itching etc.  If this is not effective let know and can recommend a prescription based antihistamine  Follow-up in 6-12 months or sooner if needed  I appreciate the opportunity to take part in York's care. Please do not hesitate to contact me with questions.  Sincerely,   8-12, MD Allergy/Immunology [facility]

## 2022-02-11 ENCOUNTER — Ambulatory Visit: Payer: 59 | Admitting: Primary Care

## 2022-02-11 NOTE — Progress Notes (Unsigned)
° °@  Patient ID: Jorge Morris, male    DOB: March 07, 1976, 46 y.o.   MRN: [id number]  No chief complaint on file.   Referring provider: Libby Maw  HPI: 46 year old male, never smoked. PMH significant for severe persistent asthma. Patient of Dr. Annamaria Boots, seen for initial consult on 01/06/22.   02/11/2022 Patient presents today for 1 month follow-up. Felt to have adult onset asthma after viral respiratory infection or exposure to dust from HVAC system. During his last visit he was given sample of Trelegy 277mcg. Ordered for PFTs and lab work (IgE, CBC with diff) when off prednisone    No Known Allergies  Immunization History  Administered Date(s) Administered   Influenza,inj,Quad PF,6+ Mos 08/25/2021   PFIZER(Purple Top)SARS-COV-2 Vaccination 03/05/2020, 03/27/2020   Tdap 02/17/2020    Past Medical History:  Diagnosis Date   Allergy    Asthma    Elevated liver enzymes    Hyperlipidemia    Osteoarthritis     Tobacco History: Social History   Tobacco Use  Smoking Status Never  Smokeless Tobacco Never   Counseling given: Not Answered   Outpatient Medications Prior to Visit  Medication Sig Dispense Refill   albuterol (VENTOLIN HFA) 108 (90 Base) MCG/ACT inhaler Inhale into the lungs.     Fluticasone-Umeclidin-Vilant (TRELEGY ELLIPTA) 200-62.5-25 MCG/ACT AEPB Inhale 1 puff into the lungs daily. 60 each 5   levocetirizine (XYZAL) 5 MG tablet Take 5 mg by mouth every evening.     montelukast (SINGULAIR) 10 MG tablet Take 10 mg by mouth daily. (Patient not taking: Reported on 02/10/2022)     Spacer/Aero-Holding Chambers Lifescape DIAMOND) MISC SMARTSIG:Via Inhaler Every 4-6 Hours     No facility-administered medications prior to visit.      Review of Systems  Review of Systems   Physical Exam  There were no vitals taken for this visit. Physical Exam   Lab Results:  CBC    Component Value Date/Time   WBC 4.7 08/27/2021 0809   RBC 5.27 08/27/2021  0809   HGB 16.4 08/27/2021 0809   HCT 48.7 08/27/2021 0809   PLT 219.0 08/27/2021 0809   MCV 92.3 08/27/2021 0809   MCHC 33.8 08/27/2021 0809   RDW 13.3 08/27/2021 0809    BMET    Component Value Date/Time   NA 139 08/27/2021 0809   K 4.4 08/27/2021 0809   CL 104 08/27/2021 0809   CO2 27 08/27/2021 0809   GLUCOSE 89 08/27/2021 0809   BUN 18 08/27/2021 0809   CREATININE 1.22 08/27/2021 0809   CALCIUM 9.3 08/27/2021 0809    BNP No results found for: BNP  ProBNP No results found for: PROBNP  Imaging: No results found.   Assessment & Plan:   No problem-specific Assessment & Plan notes found for this encounter.     Martyn Ehrich, NP 02/11/2022

## 2022-03-04 ENCOUNTER — Encounter: Payer: Self-pay | Admitting: Primary Care

## 2022-03-04 ENCOUNTER — Ambulatory Visit: Payer: 59 | Admitting: Primary Care

## 2022-03-04 ENCOUNTER — Other Ambulatory Visit: Payer: Self-pay

## 2022-03-04 VITALS — BP 130/86 | HR 78 | Ht 75.0 in | Wt 284.0 lb

## 2022-03-04 DIAGNOSIS — J4552 Severe persistent asthma with status asthmaticus: Secondary | ICD-10-CM | POA: Diagnosis not present

## 2022-03-04 LAB — CBC WITH DIFFERENTIAL/PLATELET
Basophils Absolute: 0 10*3/uL (ref 0.0–0.1)
Basophils Relative: 0.9 % (ref 0.0–3.0)
Eosinophils Absolute: 0.3 10*3/uL (ref 0.0–0.7)
Eosinophils Relative: 7.1 % — ABNORMAL HIGH (ref 0.0–5.0)
HCT: 48.4 % (ref 39.0–52.0)
Hemoglobin: 16.2 g/dL (ref 13.0–17.0)
Lymphocytes Relative: 37 % (ref 12.0–46.0)
Lymphs Abs: 1.6 10*3/uL (ref 0.7–4.0)
MCHC: 33.5 g/dL (ref 30.0–36.0)
MCV: 94 fl (ref 78.0–100.0)
Monocytes Absolute: 0.5 10*3/uL (ref 0.1–1.0)
Monocytes Relative: 11.6 % (ref 3.0–12.0)
Neutro Abs: 1.9 10*3/uL (ref 1.4–7.7)
Neutrophils Relative %: 43.4 % (ref 43.0–77.0)
Platelets: 200 10*3/uL (ref 150.0–400.0)
RBC: 5.15 Mil/uL (ref 4.22–5.81)
RDW: 13.4 % (ref 11.5–15.5)
WBC: 4.4 10*3/uL (ref 4.0–10.5)

## 2022-03-04 MED ORDER — MONTELUKAST SODIUM 10 MG PO TABS: 10.0000 mg | ORAL_TABLET | Freq: Every day | ORAL | 5 refills | Status: DC

## 2022-03-04 MED ORDER — TRELEGY ELLIPTA 200-62.5-25 MCG/ACT IN AEPB: 1.0000 | INHALATION_SPRAY | Freq: Every day | RESPIRATORY_TRACT | 5 refills | Status: DC

## 2022-03-04 NOTE — Patient Instructions (Addendum)
Recommendations: ?- Continue Trelegy one puff daily ?- Use albuterol rescue inhaler 2 puffs every 4-6 hours for breakthrough shortness of breath/wheezing  ?- Restart Singulair 10mg  at bedtime  ?- If you need to take an OTC antihistamine, try Claritin as this has less sedating OR you can try flonase nasal spray/ nasal rinses  ?- If you are needing to use your rescue inhaler daily please reach out to ? ?Orders: ?- Labs today (cbc with diff, IgE) ? ?Follow-up: ?- 6 months with Dr. [place] or sooner if needed  ? ? ?Montelukast Tablets ?What is this medication? ?MONTELUKAST (mon te LOO kast) prevents and treats the symptoms of asthma and allergies. It works by decreasing inflammation in the airways, making it easier to breathe. Do not use this medication to treat a sudden asthma attack. ?This medicine may be used for other purposes; ask your health care provider or pharmacist if you have questions. ?COMMON BRAND NAME(S): Singulair ?What should I tell my care team before I take this medication? ?They need to know if you have any of these conditions: ?Liver disease ?An unusual or allergic reaction to montelukast, other medications, foods, dyes, or preservatives ?Pregnant or trying to get pregnant ?Breast-feeding ?How should I use this medication? ?Take this medication by mouth with water. Take it as directed on the prescription label at the same time every day. You can take this medication with or without food. If it upsets your stomach, take it with food. Keep taking it unless your care team tells you to stop. ?A special MedGuide will be given to you by the pharmacist with each prescription and refill. Be sure to read this information carefully each time. ?Talk to your care team about the use of this medication in children. While this medication may be prescribed for children as young as 15 years for selected conditions, precautions do apply. ?Overdosage: If you think you have taken too much of this medicine contact a  poison control center or emergency room at once. ?NOTE: This medicine is only for you. Do not share this medicine with others. ?What if I miss a dose? ?If you miss a dose, skip it. Take your next dose at the normal time. Do not take extra or 2 doses at the same time to make up for the missed dose. ?What may interact with this medication? ?Medications for seizures like phenytoin, phenobarbital, and carbamazepine ?Rifabutin ?Rifampin ?This list may not describe all possible interactions. Give your health care provider a list of all the medicines, herbs, non-prescription drugs, or dietary supplements you use. Also tell them if you smoke, drink alcohol, or use illegal drugs. Some items may interact with your medicine. ?What should I watch for while using this medication? ?Visit your health care provider for regular checks on your progress. Tell your health care provider if your allergy or asthma symptoms do not improve. Take your medication even when you do not have symptoms. ?If you have asthma, talk to your health care provider about what to do in an acute asthma attack. Always have your rescue medication for asthma attacks with you. ?Patients and their families should watch for new or worsening thoughts of suicide or depression. Also watch for sudden changes in feelings such as feeling anxious, agitated, panicky, irritable, hostile, aggressive, impulsive, severely restless, overly excited and hyperactive, or not being able to sleep. Any worsening of mood or thoughts of suicide or dying should be reported to your health care provider right away. ?What side effects may  I notice from receiving this medication? ?Side effects that you should report to your care team as soon as possible: ?Allergic reactions--skin rash, itching, hives, swelling of the face, lips, tongue, or throat ?Flu-like symptoms--fever, chills, muscle pain, cough, headache, fatigue ?Mood and behavior changes such as anxiety, nervousness, confusion,  hallucinations, irritability, hostility, thoughts of suicide or self-harm, worsening mood, feelings of depression ?Pain, tingling, or numbness in the hands or feet ?Sinus pain or pressure around the face or forehead ?Trouble sleeping ?Vivid dreams or nightmares ?Side effects that usually do not require medical attention (report to your care team if they continue or are bothersome): ?Cough ?Diarrhea ?Headache ?Runny or stuffy nose ?Sore throat ?Stomach pain ?This list may not describe all possible side effects. Call your doctor for medical advice about side effects. You may report side effects to FDA at 1-800-FDA-1088. ?Where should I keep my medication? ?Keep out of the reach of children and pets. ?Store at room temperature between 15 and 30 degrees C (59 and 86 degrees F). Protect from light and moisture. Protect from light and moisture. Keep the container tightly closed. Get rid of any unused medication after the expiration date. ?To get rid of medications that are no longer needed or expired: ?Take the medication to a medication take-back program. Check with your pharmacy or law enforcement to find a location. ?If you cannot return the medication, check the label or package insert to see if the medication should be thrown out in the garbage or flushed down the toilet. If you are not sure, ask your care team. If it is safe to put in the trash, empty the medication out of the container. Mix the medication with cat litter, dirt, coffee grounds, or other unwanted substance. Seal the mixture in a bag or container. Put it in the trash. ?NOTE: This sheet is a summary. It may not cover all possible information. If you have questions about this medicine, talk to your doctor, pharmacist, or health care provider. ?? 2022 Elsevier/Gold Standard (2021-01-01 00:00:00) ? ? ?

## 2022-03-04 NOTE — Progress Notes (Signed)
? ?@Patient  ID: Jorge Morris, male    DOB: June 25, 1976, 46 y.o.   MRN: [id number] ? ?Chief Complaint  ?Patient presents with  ? Follow-up  ? ? ?Referring provider: ?Libby Maw,* ? ?HPI: ?46 year old male, never smoked.  Past medical history significant for severe persistent asthma, elevated LDL cholesterol, elevated liver enzymes, osteoarthritis, overweight.   ? ?Patient of Dr. Annamaria Boots, seen for initial consult on 01/06/2022 for asthma. Felt to have developed adult onset asthma after possible viral respiratory infection and/or exposure to dust from HVAC system under renovation.  He was treated with a prednisone taper and started on Trelegy 200.  Ordered for PFTs.  He will need lab work off prednisone. ? ?03/04/2022 ?Patient presents today for a 2 to 14-month follow-up. He is doing well today. No acute complaints. He is compliant with Trelegy 261mcg, feel it has really controlled his symptoms. Not currently on Singulair d/t prescription running out. He recently saw allergy in February and had allergy testing and spirometry. He has allergies to mold, cat/dog. Needs CBC with differential and IgE off prednisone  ? ?Spirometry  ?02/10/22>> FVC 7.0 (117), FEV1 4.17 (89), RATIO 60 ? ?No Known Allergies ? ?Immunization History  ?Administered Date(s) Administered  ? Influenza,inj,Quad PF,6+ Mos 08/25/2021  ? PFIZER(Purple Top)SARS-COV-2 Vaccination 03/05/2020, 03/27/2020  ? Tdap 02/17/2020  ? ? ?Past Medical History:  ?Diagnosis Date  ? Allergy   ? Asthma   ? Elevated liver enzymes   ? Hyperlipidemia   ? Osteoarthritis   ? ? ?Tobacco History: ?Social History  ? ?Tobacco Use  ?Smoking Status Never  ?Smokeless Tobacco Never  ? ?Counseling given: Not Answered ? ? ?Outpatient Medications Prior to Visit  ?Medication Sig Dispense Refill  ? albuterol (VENTOLIN HFA) 108 (90 Base) MCG/ACT inhaler Inhale into the lungs.    ? Fluticasone-Umeclidin-Vilant (TRELEGY ELLIPTA) 200-62.5-25 MCG/ACT AEPB Inhale 1 puff into the lungs  daily. 60 each 5  ? levocetirizine (XYZAL) 5 MG tablet Take 5 mg by mouth every evening. (Patient not taking: Reported on 03/04/2022)    ? Spacer/Aero-Holding Chambers (OPTICHAMBER DIAMOND) MISC SMARTSIG:Via Inhaler Every 4-6 Hours    ? montelukast (SINGULAIR) 10 MG tablet Take 10 mg by mouth daily. (Patient not taking: Reported on 02/10/2022)    ? ?No facility-administered medications prior to visit.  ? ?Review of Systems ? ?Review of Systems  ?Constitutional: Negative.   ?HENT:  Positive for postnasal drip.   ?Respiratory: Negative.    ?Cardiovascular: Negative.   ? ? ?Physical Exam ? ?BP 130/86 (BP Location: Left Arm, Cuff Size: Normal)   Pulse 78   Ht 6\' 3"  (1.905 m)   Wt 284 lb (128.8 kg)   SpO2 96%   BMI 35.50 kg/m?  ?Physical Exam ?Constitutional:   ?   Appearance: Normal appearance.  ?HENT:  ?   Mouth/Throat:  ?   Mouth: Mucous membranes are moist.  ?   Pharynx: Oropharynx is clear.  ?Cardiovascular:  ?   Rate and Rhythm: Normal rate and regular rhythm.  ?Pulmonary:  ?   Effort: Pulmonary effort is normal.  ?   Breath sounds: Normal breath sounds.  ?Musculoskeletal:     ?   General: Normal range of motion.  ?Skin: ?   General: Skin is warm and dry.  ?Neurological:  ?   General: No focal deficit present.  ?   Mental Status: He is alert and oriented to person, place, and time. Mental status is at baseline.  ?Psychiatric:     ?  Mood and Affect: Mood normal.     ?   Behavior: Behavior normal.     ?   Thought Content: Thought content normal.     ?   Judgment: Judgment normal.  ?  ? ?Lab Results: ? ?CBC ?   ?Component Value Date/Time  ? WBC 4.4 03/04/2022 1021  ? RBC 5.15 03/04/2022 1021  ? HGB 16.2 03/04/2022 1021  ? HCT 48.4 03/04/2022 1021  ? PLT 200.0 03/04/2022 1021  ? MCV 94.0 03/04/2022 1021  ? MCHC 33.5 03/04/2022 1021  ? RDW 13.4 03/04/2022 1021  ? LYMPHSABS 1.6 03/04/2022 1021  ? MONOABS 0.5 03/04/2022 1021  ? EOSABS 0.3 03/04/2022 1021  ? BASOSABS 0.0 03/04/2022 1021  ? ? ?BMET ?   ?Component Value  Date/Time  ? NA 139 08/27/2021 0809  ? K 4.4 08/27/2021 0809  ? CL 104 08/27/2021 0809  ? CO2 27 08/27/2021 0809  ? GLUCOSE 89 08/27/2021 0809  ? BUN 18 08/27/2021 0809  ? CREATININE 1.22 08/27/2021 0809  ? CALCIUM 9.3 08/27/2021 0809  ? ? ?BNP ?No results found for: BNP ? ?ProBNP ?No results found for: PROBNP ? ?Imaging: ?No results found. ? ? ?Assessment & Plan:  ? ?Severe persistent asthma with status asthmaticus ?-Stable; Adult onset. No acute symptoms or recent exacerbations. Asthma symptoms controlled on Trelegy 253mcg and prn albuterol hfa. Restarting Singulair 10mg  at bedtime and prn OTC antihistamine for allergy symptoms.  ? ?Orders: ?- Labs today (cbc with diff, IgE) ? ?Follow-up: ?- 6 months with Dr. Annamaria Boots or sooner if needed  ? ? ?Martyn Ehrich, NP ?03/07/2022 ? ?

## 2022-03-07 LAB — IGE: IgE (Immunoglobulin E), Serum: 195 kU/L — ABNORMAL HIGH (ref <=114)

## 2022-03-07 NOTE — Assessment & Plan Note (Signed)
-  Stable; Adult onset. No acute symptoms or recent exacerbations. Asthma symptoms controlled on Trelegy and prn albuterol hfa. Restarting Singulair 10mg  at bedtime and prn OTC antihistamine for allergy symptoms.  ? ?Orders: ?- Labs today (cbc with diff, IgE) ? ?Follow-up: ?- 6 months with Dr. or sooner if needed  ? ?

## 2022-03-07 NOTE — Progress Notes (Signed)
Allergy markers on labs were elevated, Singulair and OTC Claritin will help some with this. Continue trelegy 200. May need additional medications such as injections if symptoms flare more than twice a year.

## 2022-04-11 ENCOUNTER — Telehealth: Payer: Self-pay | Admitting: Primary Care

## 2022-04-12 NOTE — Telephone Encounter (Signed)
I called the patient and he reports that he had already picked up the medication at the [facility] and did not have a refill at that location for [facility] and he was set for his Trelegy. Nothing further needed.  ?

## 2022-05-02 ENCOUNTER — Other Ambulatory Visit: Payer: Self-pay | Admitting: Primary Care

## 2022-05-02 NOTE — Telephone Encounter (Signed)
Please advise on med refill. ? ? ?No Known Allergies ? ? ?Current Outpatient Medications:  ?  albuterol (VENTOLIN HFA) 108 (90 Base) MCG/ACT inhaler, Inhale into the lungs., Disp: , Rfl:  ?  Fluticasone-Umeclidin-Vilant (TRELEGY ELLIPTA) 200-62.5-25 MCG/ACT AEPB, Inhale 1 puff into the lungs daily., Disp: 60 each, Rfl: 5 ?  levocetirizine (XYZAL) 5 MG tablet, Take 5 mg by mouth every evening. (Patient not taking: Reported on 03/04/2022), Disp: , Rfl:  ?  montelukast (SINGULAIR) 10 MG tablet, Take 1 tablet (10 mg total) by mouth daily., Disp: 30 tablet, Rfl: 5 ?  Spacer/Aero-Holding Chambers [facility] DIAMOND) MISC, SMARTSIG:Via Inhaler Every 4-6 Hours, Disp: , Rfl:  ? ?

## 2022-05-02 NOTE — Telephone Encounter (Signed)
It would be best if ask his primary physician (Dr Oda Cogan) for this. ?

## 2022-05-28 ENCOUNTER — Ambulatory Visit (HOSPITAL_BASED_OUTPATIENT_CLINIC_OR_DEPARTMENT_OTHER)
Admission: RE | Admit: 2022-05-28 | Discharge: 2022-05-28 | Disposition: A | Discharge status: Home / Self Care | Payer: 59 | Source: Ambulatory Visit | Attending: Urgent Care | Admitting: Urgent Care

## 2022-05-28 ENCOUNTER — Ambulatory Visit
Admission: RE | Admit: 2022-05-28 | Discharge: 2022-05-28 | Disposition: A | Discharge status: Home / Self Care | Payer: 59 | Source: Ambulatory Visit | Attending: Urgent Care | Admitting: Urgent Care

## 2022-05-28 VITALS — BP 132/93 | HR 75 | Temp 98.2°F | Resp 18

## 2022-05-28 DIAGNOSIS — L089 Local infection of the skin and subcutaneous tissue, unspecified: Secondary | ICD-10-CM

## 2022-05-28 DIAGNOSIS — M25522 Pain in left elbow: Secondary | ICD-10-CM | POA: Insufficient documentation

## 2022-05-28 DIAGNOSIS — T148XXA Other injury of unspecified body region, initial encounter: Secondary | ICD-10-CM

## 2022-05-28 MED ORDER — DOXYCYCLINE HYCLATE 100 MG PO CAPS: 100.0000 mg | ORAL_CAPSULE | Freq: Two times a day (BID) | ORAL | 0 refills | Status: DC

## 2022-05-28 NOTE — ED Provider Notes (Signed)
[facility]   MRN: [id number] DOB: 1976-08-04  Subjective:   Jeremyah Jelley is a 46 y.o. male presenting for 1 week history of persistent worsening left elbow swelling, redness, mild intermittent pain.  Has no pain today.  Patient had a scrape over the area and then bumped his elbow again.  Has a previous history of infective bursitis and responds well to antibiotics.  No fever, heart racing, chest pain, nausea, vomiting, abdominal pain.  No current facility-administered medications for this encounter.  Current Outpatient Medications:    albuterol (VENTOLIN HFA) 108 (90 Base) MCG/ACT inhaler, Inhale into the lungs., Disp: , Rfl:    Fluticasone-Umeclidin-Vilant (TRELEGY ELLIPTA) 200-62.5-25 MCG/ACT AEPB, Inhale 1 puff into the lungs daily., Disp: 60 each, Rfl: 5   levocetirizine (XYZAL) 5 MG tablet, Take 5 mg by mouth every evening. (Patient not taking: Reported on 03/04/2022), Disp: , Rfl:    montelukast (SINGULAIR) 10 MG tablet, Take 1 tablet (10 mg total) by mouth daily., Disp: 30 tablet, Rfl: 5   Spacer/Aero-Holding Chambers (OPTICHAMBER DIAMOND) MISC, SMARTSIG:Via Inhaler Every 4-6 Hours, Disp: , Rfl:    No Known Allergies  Past Medical History:  Diagnosis Date   Allergy    Asthma    Elevated liver enzymes    Hyperlipidemia    Osteoarthritis      Past Surgical History:  Procedure Laterality Date   TESTICLE SURGERY     VASECTOMY  2013    Family History  Problem Relation Age of Onset   Cancer Mother    Healthy Father    Cancer Maternal Grandmother    Cancer Maternal Grandfather    Colon cancer Neg Hx    Esophageal cancer Neg Hx    Rectal cancer Neg Hx    Stomach cancer Neg Hx     Social History   Tobacco Use   Smoking status: Never   Smokeless tobacco: Never  Vaping Use   Vaping Use: Never used  Substance Use Topics   Alcohol use: Yes    Comment: socially   Drug use: Never    ROS   Objective:   Vitals: BP (!) 132/93 (BP  Location: Right Arm)   Pulse 75   Temp 98.2 F (36.8 C) (Oral)   Resp 18   SpO2 97%   Physical Exam Constitutional:      General: He is not in acute distress.    Appearance: Normal appearance. He is well-developed and normal weight. He is not ill-appearing, toxic-appearing or diaphoretic.  HENT:     Head: Normocephalic and atraumatic.     Right Ear: External ear normal.     Left Ear: External ear normal.     Nose: Nose normal.     Mouth/Throat:     Pharynx: Oropharynx is clear.  Eyes:     General: No scleral icterus.       Right eye: No discharge.        Left eye: No discharge.     Extraocular Movements: Extraocular movements intact.  Cardiovascular:     Rate and Rhythm: Normal rate.  Pulmonary:     Effort: Pulmonary effort is normal.  Musculoskeletal:     Left elbow: Swelling (with associated warmth and erythema as depicted) present. No deformity, effusion or lacerations. Decreased range of motion (near full rom). Tenderness present in olecranon process (mild extending into the forearm). No radial head, medial epicondyle or lateral epicondyle tenderness.     Cervical back: Normal range of motion.  Neurological:     Mental Status: He is alert and oriented to person, place, and time.  Psychiatric:        Mood and Affect: Mood normal.        Behavior: Behavior normal.        Thought Content: Thought content normal.        Judgment: Judgment normal.        Assessment and Plan :   PDMP not reviewed this encounter.  1. Infected abrasion   2. Left elbow pain    Generally, we do not have a radiology technician onsite today.  Therefore we are pursuing an outpatient x-ray to rule out septic arthritis, osteomyelitis.  I do have low suspicion for this given patient's physical exam findings, vital signs.  However, I am starting him on doxycycline as a subjective primary problem is an infected abrasion that has led to cellulitis.  Use Tylenol and/or ibuprofen for pain relief.   [place] the [place] but I will call him to make sure he knows his results and update treatment plan as necessary. Counseled patient on potential for adverse effects with medications prescribed/recommended today, ER and return-to-clinic precautions discussed, patient verbalized understanding.    Wallis Bamberg, [place] 05/28/22 [phone]

## 2022-05-28 NOTE — Discharge Instructions (Addendum)
I have placed orders to have an x-ray done at the med center in [place].  Please check in through the emergency room but do not register to be seen as a patient.  They will contact the radiology department and a staff member will take you to go get the x-ray done. I will review x-ray results with you once I receive the report which usually takes about an hour after the x-ray is done.    

## 2022-05-28 NOTE — ED Triage Notes (Signed)
Pt c/o swelling and possible bursitis to his left elbow that began last weekend. The area is inflamed and he had a healed over abrasion the the left elbow.

## 2022-06-10 ENCOUNTER — Encounter: Payer: Self-pay | Admitting: Family Medicine

## 2022-06-10 ENCOUNTER — Ambulatory Visit: Payer: 59 | Admitting: Family Medicine

## 2022-06-10 VITALS — BP 126/88 | HR 66 | Temp 97.6°F | Ht 75.0 in | Wt 278.2 lb

## 2022-06-10 DIAGNOSIS — M7022 Olecranon bursitis, left elbow: Secondary | ICD-10-CM

## 2022-06-10 MED ORDER — CLARITHROMYCIN ER 500 MG PO TB24: 1000.0000 mg | ORAL_TABLET | Freq: Every day | ORAL | 0 refills | Status: DC

## 2022-06-10 MED ORDER — CLARITHROMYCIN ER 500 MG PO TB24
1000.0000 mg | ORAL_TABLET | Freq: Every day | ORAL | 0 refills | Status: AC
Start: 2022-06-10 — End: 2022-06-20

## 2022-08-10 ENCOUNTER — Ambulatory Visit: Payer: 59 | Admitting: Allergy

## 2022-08-19 ENCOUNTER — Other Ambulatory Visit: Payer: Self-pay | Admitting: Primary Care

## 2022-08-25 ENCOUNTER — Encounter: Payer: Self-pay | Admitting: Family Medicine

## 2022-08-25 ENCOUNTER — Ambulatory Visit (INDEPENDENT_AMBULATORY_CARE_PROVIDER_SITE_OTHER): Payer: 59 | Admitting: Family Medicine

## 2022-08-25 VITALS — BP 116/72 | HR 84 | Temp 97.0°F | Ht 75.0 in | Wt 281.8 lb

## 2022-08-25 DIAGNOSIS — J4552 Severe persistent asthma with status asthmaticus: Secondary | ICD-10-CM | POA: Diagnosis not present

## 2022-08-25 DIAGNOSIS — Z23 Encounter for immunization: Secondary | ICD-10-CM | POA: Insufficient documentation

## 2022-08-25 DIAGNOSIS — Z0000 Encounter for general adult medical examination without abnormal findings: Secondary | ICD-10-CM | POA: Diagnosis not present

## 2022-08-25 MED ORDER — TRELEGY ELLIPTA 200-62.5-25 MCG/ACT IN AEPB: 1.0000 | INHALATION_SPRAY | Freq: Every day | RESPIRATORY_TRACT | 5 refills | Status: DC
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
## 2022-08-26 ENCOUNTER — Other Ambulatory Visit (INDEPENDENT_AMBULATORY_CARE_PROVIDER_SITE_OTHER): Payer: 59

## 2022-08-26 DIAGNOSIS — J4552 Severe persistent asthma with status asthmaticus: Secondary | ICD-10-CM | POA: Diagnosis not present

## 2022-08-26 LAB — COMPREHENSIVE METABOLIC PANEL
ALT: 44 U/L (ref 0–53)
AST: 24 U/L (ref 0–37)
Albumin: 4.2 g/dL (ref 3.5–5.2)
Alkaline Phosphatase: 71 U/L (ref 39–117)
BUN: 20 mg/dL (ref 6–23)
CO2: 26 mEq/L (ref 19–32)
Calcium: 9.2 mg/dL (ref 8.4–10.5)
Chloride: 105 mEq/L (ref 96–112)
Creatinine, Ser: 1.24 mg/dL (ref 0.40–1.50)
GFR: 69.16 mL/min (ref >60.00)
Glucose, Bld: 92 mg/dL (ref 70–99)
Potassium: 4.1 mEq/L (ref 3.5–5.1)
Sodium: 138 mEq/L (ref 135–145)
Total Bilirubin: 0.6 mg/dL (ref 0.2–1.2)
Total Protein: 7.1 g/dL (ref 6.0–8.3)

## 2022-08-26 LAB — URINALYSIS, ROUTINE W REFLEX MICROSCOPIC
Bilirubin Urine: NEGATIVE
Hgb urine dipstick: NEGATIVE
Ketones, ur: NEGATIVE
Leukocytes,Ua: NEGATIVE
Nitrite: NEGATIVE
Specific Gravity, Urine: 1.025 (ref 1.000–1.030)
Total Protein, Urine: NEGATIVE
Urine Glucose: NEGATIVE
Urobilinogen, UA: 0.2 (ref 0.0–1.0)
pH: 6 (ref 5.0–8.0)

## 2022-08-26 LAB — LIPID PANEL
Cholesterol: 190 mg/dL (ref 0–200)
HDL: 32.6 mg/dL — ABNORMAL LOW (ref >39.00)
LDL Cholesterol: 139 mg/dL — ABNORMAL HIGH (ref 0–99)
NonHDL: 157.76
Total CHOL/HDL Ratio: 6
Triglycerides: 96 mg/dL (ref 0.0–149.0)
VLDL: 19.2 mg/dL (ref 0.0–40.0)

## 2022-08-26 LAB — CBC
HCT: 48.1 % (ref 39.0–52.0)
Hemoglobin: 16.2 g/dL (ref 13.0–17.0)
MCHC: 33.8 g/dL (ref 30.0–36.0)
MCV: 92.2 fl (ref 78.0–100.0)
Platelets: 215 10*3/uL (ref 150.0–400.0)
RBC: 5.21 Mil/uL (ref 4.22–5.81)
RDW: 13.3 % (ref 11.5–15.5)
WBC: 4.9 10*3/uL (ref 4.0–10.5)
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
## 2022-09-05 ENCOUNTER — Ambulatory Visit: Payer: 59 | Admitting: Internal Medicine

## 2022-09-05 ENCOUNTER — Encounter: Payer: Self-pay | Admitting: Internal Medicine

## 2022-09-05 DIAGNOSIS — J3089 Other allergic rhinitis: Secondary | ICD-10-CM | POA: Diagnosis not present

## 2022-09-05 DIAGNOSIS — J4552 Severe persistent asthma with status asthmaticus: Secondary | ICD-10-CM | POA: Diagnosis not present

## 2022-09-05 DIAGNOSIS — J302 Other seasonal allergic rhinitis: Secondary | ICD-10-CM | POA: Insufficient documentation

## 2022-09-05 NOTE — Patient Instructions (Signed)
Sample x 2 Trelegy 100   inhale 1 puff then rinse mouth, once daily. See if this controls you as well as Trelegy 200.  For dry nose- try otc nasal saline gel    -   use as often as you want  Try otc Flonase (fluticasone) nasal spray

## 2022-09-05 NOTE — Assessment & Plan Note (Addendum)
Primary complaint is "dryness".  Discussed adequate hydration, saline nasal gel, Flonase

## 2022-09-05 NOTE — Assessment & Plan Note (Signed)
Better control.  Allergic component recognized so we can anticipate there may be some seasonal variation. Plan-okay to try off and on Singulair.  We can let him try Trelegy 100 at some point when we have samples.

## 2022-10-12 ENCOUNTER — Ambulatory Visit: Payer: 59 | Admitting: Allergy

## 2022-10-12 ENCOUNTER — Encounter: Payer: Self-pay | Admitting: Allergy

## 2022-10-12 VITALS — BP 134/80 | HR 97 | Temp 98.3°F | Resp 12 | Ht 75.0 in | Wt 286.8 lb

## 2022-10-12 DIAGNOSIS — J4552 Severe persistent asthma with status asthmaticus: Secondary | ICD-10-CM

## 2022-10-12 DIAGNOSIS — J4550 Severe persistent asthma, uncomplicated: Secondary | ICD-10-CM | POA: Diagnosis not present

## 2022-10-12 DIAGNOSIS — J3089 Other allergic rhinitis: Secondary | ICD-10-CM

## 2022-10-12 NOTE — Patient Instructions (Addendum)
-   Continue avoidance measures for oak tree, grasses, ragweed, indoor molds, outdoor molds, dust mites, cat, and dog. - Continue with:  -Trelegy 281mcg 1 puff daily.  -Have access to albuterol inhaler 2 puffs every 4-6 hours as needed for cough/wheeze/shortness of breath/chest tightness.  May use 15-20 minutes prior to activity.   Monitor frequency of use.  - Singulair 10mg  daily during your high allergy seasons  - If allergy symptoms are worsening (nasal congestion/drainage, itchy/watery eyes, sneezing etc) let me know and can recommend an prescription based antihistamine to use and see if you respond well to it  Follow-up in 6-12 months or sooner if needed

## 2022-10-12 NOTE — Progress Notes (Unsigned)
Follow-up Note  RE: Jorge Morris MRN: [id number] DOB: December 06, 1976 Date of Office Visit: 10/12/2022   History of present illness: Jorge Morris is a 46 y.o. male presenting today for follow-up of asthma and allergic rhinitis.  He was last seen in the office on 02/10/2022 by myself.  He has not had any major health changes, surgeries or hospitalizations since this visit.  This is around the time last year where he had his severe type asthma flare.  He states the he has started to do the complete removal at his house.  He is going to see if he can outsource this activity as he does a lot of what triggers him comes from the mold from the leaves.  His allergy testing was positive to both indoor and outdoor molds as well as to tree pollen, grass pollen and ragweed.    He continues to use Trelegy 200 mcg 1 puff daily in the morning.  He has not required any albuterol use.  She did try to stop Singulair for some time but states he developed congestion and went back on the Singulair and the congestion improved thus he realizes that the Singulair is effective for him.  However he feels like he really only needs to use the Singulair seasonally and potentially not year-round.  He has not been on any antihistamines as he has not had real effectiveness with the antihistamines he has tried including Zyrtec, Allegra and Xyzal.  He also states that Xyzal makes him quite sleepy.  Review of systems: Review of Systems  Constitutional: Negative.   HENT: Negative.    Eyes: Negative.   Respiratory: Negative.    Cardiovascular: Negative.   Musculoskeletal: Negative.   Skin: Negative.   Allergic/Immunologic: Negative.   Neurological: Negative.      All other systems negative unless noted above in HPI  Past medical/social/surgical/family history have been reviewed and are unchanged unless specifically indicated below.  No changes  Medication List: Current Outpatient Medications  Medication Sig Dispense  Refill   Spacer/Aero-Holding Chambers (OPTICHAMBER DIAMOND) MISC SMARTSIG:Via Inhaler Every 4-6 Hours     albuterol (VENTOLIN HFA) 108 (90 Base) MCG/ACT inhaler Inhale 2 puffs into the lungs every 6 (six) hours as needed for wheezing or shortness of breath. 18 g 1   Fluticasone-Umeclidin-Vilant (TRELEGY ELLIPTA) 200-62.5-25 MCG/ACT AEPB Inhale 1 puff into the lungs daily. 60 each 5   montelukast (SINGULAIR) 10 MG tablet Take 1 tablet (10 mg total) by mouth daily. 30 tablet 5   No current facility-administered medications for this visit.     Known medication allergies: Allergies  Allergen Reactions   Cat Hair Extract    Molds & Smuts    Jorge Morris      Physical examination: Blood pressure 134/80, pulse 97, temperature 98.3 F (36.8 C), temperature source Temporal, resp. rate 12, height 6\' 3"  (1.905 m), weight 286 lb 12.8 oz (130.1 kg), SpO2 95 %.  General: Alert, interactive, in no acute distress. HEENT: PERRLA, TMs pearly gray, turbinates non-edematous without discharge, post-pharynx non erythematous. Neck: Supple without lymphadenopathy. Lungs: Clear to auscultation without wheezing, rhonchi or rales. {no increased work of breathing. CV: Normal S1, S2 without murmurs. Abdomen: Nondistended, nontender. Skin: Warm and dry, without lesions or rashes. Extremities:  No clubbing, cyanosis or edema. Neuro:   Grossly intact.  Diagnositics/Labs: Spirometry: FEV1: 5.69L 121%, FVC: 6.28L 104%, ratio consistent with nonobstructive pattern  Assessment and plan: Severe persistent asthma Allergic rhinitis   - Continue avoidance measures for  oak tree, grasses, ragweed, indoor molds, outdoor molds, dust mites, cat, and dog. - Continue with:  -Trelegy 258mcg 1 puff daily.  -Have access to albuterol inhaler 2 puffs every 4-6 hours as needed for cough/wheeze/shortness of breath/chest tightness.  May use 15-20 minutes prior to activity.   Monitor frequency of use.  - Singulair 10mg  daily during  your high allergy seasons  - If allergy symptoms are worsening (nasal congestion/drainage, itchy/watery eyes, sneezing etc) let me know and can recommend an prescription based antihistamine to use and see if you respond well to it  Follow-up in 6-12 months or sooner if needed  I appreciate the opportunity to take part in Myer's care. Please do not hesitate to contact me with questions.  Sincerely,   Prudy Feeler, MD Allergy/Immunology [facility]

## 2022-10-13 MED ORDER — ALBUTEROL SULFATE HFA 108 (90 BASE) MCG/ACT IN AERS: 2.0000 | INHALATION_SPRAY | Freq: Four times a day (QID) | RESPIRATORY_TRACT | 1 refills | Status: DC | PRN

## 2022-10-13 MED ORDER — TRELEGY ELLIPTA 200-62.5-25 MCG/ACT IN AEPB: 1.0000 | INHALATION_SPRAY | Freq: Every day | RESPIRATORY_TRACT | 5 refills | Status: DC

## 2022-10-13 MED ORDER — MONTELUKAST SODIUM 10 MG PO TABS: 10.0000 mg | ORAL_TABLET | Freq: Every day | ORAL | 5 refills | Status: DC

## 2023-03-02 ENCOUNTER — Ambulatory Visit: Payer: 59 | Admitting: Family Medicine

## 2023-03-02 ENCOUNTER — Encounter: Payer: Self-pay | Admitting: Family Medicine

## 2023-03-02 VITALS — BP 136/86 | HR 79 | Temp 98.4°F | Ht 75.0 in | Wt 286.4 lb

## 2023-03-02 DIAGNOSIS — E7800 Pure hypercholesterolemia, unspecified: Secondary | ICD-10-CM | POA: Diagnosis not present

## 2023-03-02 DIAGNOSIS — L03031 Cellulitis of right toe: Secondary | ICD-10-CM | POA: Insufficient documentation

## 2023-03-02 MED ORDER — CEPHALEXIN 500 MG PO CAPS: 500.0000 mg | ORAL_CAPSULE | Freq: Four times a day (QID) | ORAL | 0 refills | Status: AC

## 2023-03-02 NOTE — Progress Notes (Signed)
Established Patient Office Visit   Subjective:  Patient ID: Jorge Morris, male    DOB: 07/05/1976  Age: 47 y.o. MRN: [id number]  Chief Complaint  Patient presents with   Puncture Wound    Cut on right pinky toe swollen and very sore seems to be infected x 4 days.     HPI Encounter Diagnoses  Name Primary?   Cellulitis of toe of right foot Yes   Elevated LDL cholesterol level    5 to 6-day history of swelling and discomfort in his right fifth toe.  History of onychomycosis in the toenails of his feet.  He has been soaking it and has kept it wrapped.   Review of Systems  Constitutional: Negative.   HENT: Negative.    Eyes:  Negative for blurred vision, discharge and redness.  Respiratory: Negative.    Cardiovascular: Negative.   Gastrointestinal:  Negative for abdominal pain.  Genitourinary: Negative.   Musculoskeletal: Negative.  Negative for myalgias.  Skin:  Negative for rash.  Neurological:  Negative for tingling, loss of consciousness and weakness.  Endo/Heme/Allergies:  Negative for polydipsia.     Current Outpatient Medications:    albuterol (VENTOLIN HFA) 108 (90 Base) MCG/ACT inhaler, Inhale 2 puffs into the lungs every 6 (six) hours as needed for wheezing or shortness of breath., Disp: 18 g, Rfl: 1   cephALEXin (KEFLEX) 500 MG capsule, Take 1 capsule (500 mg total) by mouth 4 (four) times daily for 10 days., Disp: 40 capsule, Rfl: 0   Fluticasone-Umeclidin-Vilant (TRELEGY ELLIPTA) 200-62.5-25 MCG/ACT AEPB, Inhale 1 puff into the lungs daily., Disp: 60 each, Rfl: 5   montelukast (SINGULAIR) 10 MG tablet, Take 1 tablet (10 mg total) by mouth daily., Disp: 30 tablet, Rfl: 5   Spacer/Aero-Holding Chambers (OPTICHAMBER DIAMOND) MISC, SMARTSIG:Via Inhaler Every 4-6 Hours, Disp: , Rfl:    Objective:     BP 136/86 (BP Location: Left Arm, Patient Position: Sitting, Cuff Size: Large)   Pulse 79   Temp 98.4 F (36.9 C) (Temporal)   Ht '6\' 3"'$  (1.905 m)   Wt 286 lb 6.4  oz (129.9 kg)   SpO2 94%   BMI 35.80 kg/m  BP Readings from Last 3 Encounters:  03/02/23 136/86  10/12/22 134/80  09/05/22 130/80   Wt Readings from Last 3 Encounters:  03/02/23 286 lb 6.4 oz (129.9 kg)  10/12/22 286 lb 12.8 oz (130.1 kg)  09/05/22 284 lb 9.6 oz (129.1 kg)      Physical Exam Constitutional:      General: He is not in acute distress.    Appearance: Normal appearance. He is not ill-appearing, toxic-appearing or diaphoretic.  HENT:     Head: Normocephalic and atraumatic.     Right Ear: External ear normal.     Left Ear: External ear normal.  Eyes:     General: No scleral icterus.       Right eye: No discharge.        Left eye: No discharge.     Extraocular Movements: Extraocular movements intact.     Conjunctiva/sclera: Conjunctivae normal.  Pulmonary:     Effort: Pulmonary effort is normal. No respiratory distress.  Skin:    General: Skin is warm and dry.       Neurological:     Mental Status: He is alert and oriented to person, place, and time.  Psychiatric:        Mood and Affect: Mood normal.        Behavior: Behavior  normal.      No results found for any visits on 03/02/23.    The 10-year ASCVD risk score (Arnett DK, et al., 2019) is: 4.7%    Assessment & Plan:   Cellulitis of toe of right foot -     Cephalexin; Take 1 capsule (500 mg total) by mouth 4 (four) times daily for 10 days.  Dispense: 40 capsule; Refill: 0  Elevated LDL cholesterol level    Return if symptoms worsen or fail to improve.  Information was given on lowering cholesterol.  Information was given on cellulitis.  Keflex 4 times daily for 10 days.  Will return if it does not improve.  May return to discuss treatment of onychomycosis.  Libby Maw, MD

## 2023-08-31 ENCOUNTER — Other Ambulatory Visit: Payer: Self-pay | Admitting: Family Medicine

## 2023-09-05 NOTE — Progress Notes (Unsigned)
HPI M never smoker followed for Asthma, complicated by  Osteoarthritis, Elevated Liver Enzyme, Hyperlipidemia, Overweight -Ventolin HFA/spacer tube, Singulair, Xyzal 5 mg, Trelegy 200, Covid vax-2 Phizer Flu vax- Allergy Testing [phone] +mold, cat, dog  ===================================================  09/05/22- 46 yoM never smoker followed for Asthma, complicated by  Osteoarthritis, Elevated Liver Enzyme, Hyperlipidemia, Overweight -Ventolin HFA/spacer tube, Singulair, Xyzal 5 mg, Trelegy 200, Covid vax-2 Phizer Flu vax- Allergy Testing [phone] +mold, cat, dog Spirometry 02/10/22-FVC 7.0 (117), FEV1 4.17 (89), RATIO 60 LOV Walsh, NP 03/04/22-  EOS 7.1H,  IgE 195 H > consider Biologic prn ------Pt f/u asthma seems to be okay, his sinuses just seem to be extremely dry. Allergy testing showed triggers were mold, oak, cats. Very pleased with his asthma control using Trelegy.  We discussed possibility of reducing to Trelegy 100 but have no samples of that currently.  He can also assess Singulair effectiveness by taking it on and off for a week at a time. Recently has had some nasal dryness.  He thinks he noted it before he left [place] for a trip to [place] state so it was not just the airplane.  He is about to fly again to the [place].  We discussed nasal sprays.  He is not currently using an antihistamine.  09/07/23- 47 yoM never smoker followed for Asthma, complicated by  Osteoarthritis, Elevated Liver Enzyme, Hyperlipidemia, Overweight -Ventolin HFA/spacer tube, Singulair, Xyzal 5 mg, Trelegy 200, ACT 25 -----Breathing is good  Doing very well.  Expects Fall season to be perhaps his worst time of year.  Has not needed rescue inhaler in a very long time.  Does need refills on Trelegy which we discussed. Controlling weight with diet.  ROS-see HPI   + = positive Constitutional:    weight loss, night sweats, fevers, chills, fatigue, lassitude. HEENT:    headaches, difficulty  swallowing, tooth/dental problems, +sore throat,       +sneezing, itching, ear ache, +nasal congestion, post nasal drip, snoring CV:    +chest pain, orthopnea, PND, swelling in lower extremities, anasarca,                     dizziness, palpitations Resp:   +shortness of breath with exertion or at rest.                +productive cough,  + non-productive cough, coughing up of blood.              change in color of mucus.  +wheezing.   Skin:    rash or lesions. GI:  No-   heartburn, +indigestion, abdominal pain, nausea, vomiting, diarrhea,                 change in bowel habits, loss of appetite GU: dysuria, change in color of urine, no urgency or frequency.   flank pain. MS:   joint pain, stiffness, decreased range of motion, back pain. Neuro-     nothing unusual Psych:  change in mood or affect.  depression or anxiety.   memory loss.  OBJ- Physical Exam General- Alert, Oriented, Affect-appropriate, Distress- none acute Skin- rash-none, lesions- none, excoriation- none Lymphadenopathy- none Head- atraumatic            Eyes- Gross vision intact, PERRLA, conjunctivae and secretions clear            Ears- Hearing, canals-normal            Nose- Clear, no-Septal dev, mucus, polyps, erosion, perforation  Throat- Mallampati II , mucosa clear , drainage- none, tonsils- atrophic Neck- flexible , trachea midline, no stridor , thyroid nl, carotid no bruit Chest - symmetrical excursion , unlabored           Heart/CV- RRR , no murmur , no gallop  , no rub, nl s1 s2                           - JVD- none , edema- none, stasis changes- none, varices- none           Lung- clear to P&A, wheeze-none, cough-none , dullness-none, rub- none           Chest wall-  Abd-  Br/ Gen/ Rectal- Not done, not indicated Extrem- cyanosis- none, clubbing, none, atrophy- none, strength- nl Neuro- grossly intact to observation

## 2023-09-07 ENCOUNTER — Ambulatory Visit: Payer: 59 | Admitting: Internal Medicine

## 2023-09-07 ENCOUNTER — Encounter: Payer: Self-pay | Admitting: Internal Medicine

## 2023-09-07 VITALS — BP 130/84 | HR 71 | Temp 97.5°F | Ht 75.0 in | Wt 278.0 lb

## 2023-09-07 DIAGNOSIS — J4552 Severe persistent asthma with status asthmaticus: Secondary | ICD-10-CM

## 2023-09-07 DIAGNOSIS — E663 Overweight: Secondary | ICD-10-CM | POA: Diagnosis not present

## 2023-09-07 MED ORDER — TRELEGY ELLIPTA 200-62.5-25 MCG/ACT IN AEPB: INHALATION_SPRAY | RESPIRATORY_TRACT | 4 refills | Status: AC

## 2023-09-07 MED ORDER — ALBUTEROL SULFATE HFA 108 (90 BASE) MCG/ACT IN AERS: 2.0000 | INHALATION_SPRAY | Freq: Four times a day (QID) | RESPIRATORY_TRACT | 4 refills | Status: AC | PRN

## 2023-09-07 NOTE — Assessment & Plan Note (Signed)
Controlling with diet

## 2023-09-07 NOTE — Assessment & Plan Note (Signed)
Uncomplicated, well-controlled. Plan-refill Trelegy.  Medications discussed.

## 2023-09-07 NOTE — Patient Instructions (Signed)
Refills sent for Trelegy and albuterol hfa  Order- sample x 2 Trelegy 200 if available   Inhale 1 puff then rinse mouth, once daily

## 2023-09-12 ENCOUNTER — Other Ambulatory Visit: Payer: Self-pay | Admitting: Allergy

## 2023-09-13 ENCOUNTER — Other Ambulatory Visit: Payer: Self-pay

## 2023-09-13 MED ORDER — MONTELUKAST SODIUM 10 MG PO TABS: 10.0000 mg | ORAL_TABLET | Freq: Every day | ORAL | 0 refills | Status: DC

## 2023-09-14 IMAGING — DX DG ELBOW COMPLETE 3+V*L*
4 series · 4 of 4 positions shown · non-contrast
Comparison: None Available.

CLINICAL DATA: Left elbow swelling

EXAM:
LEFT ELBOW - COMPLETE 3+ VIEW

[elbow obl (1 of 2)]
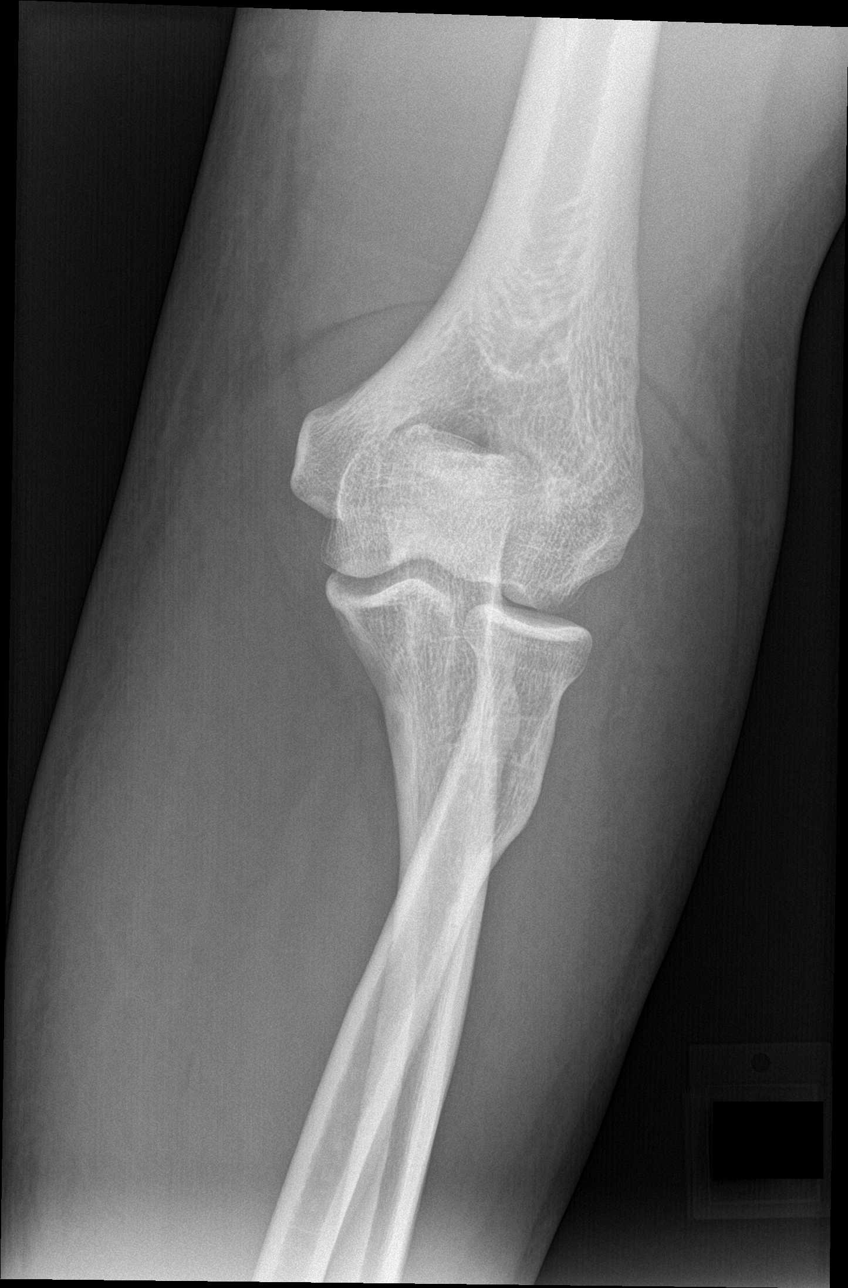

[elbow obl (2 of 2)]
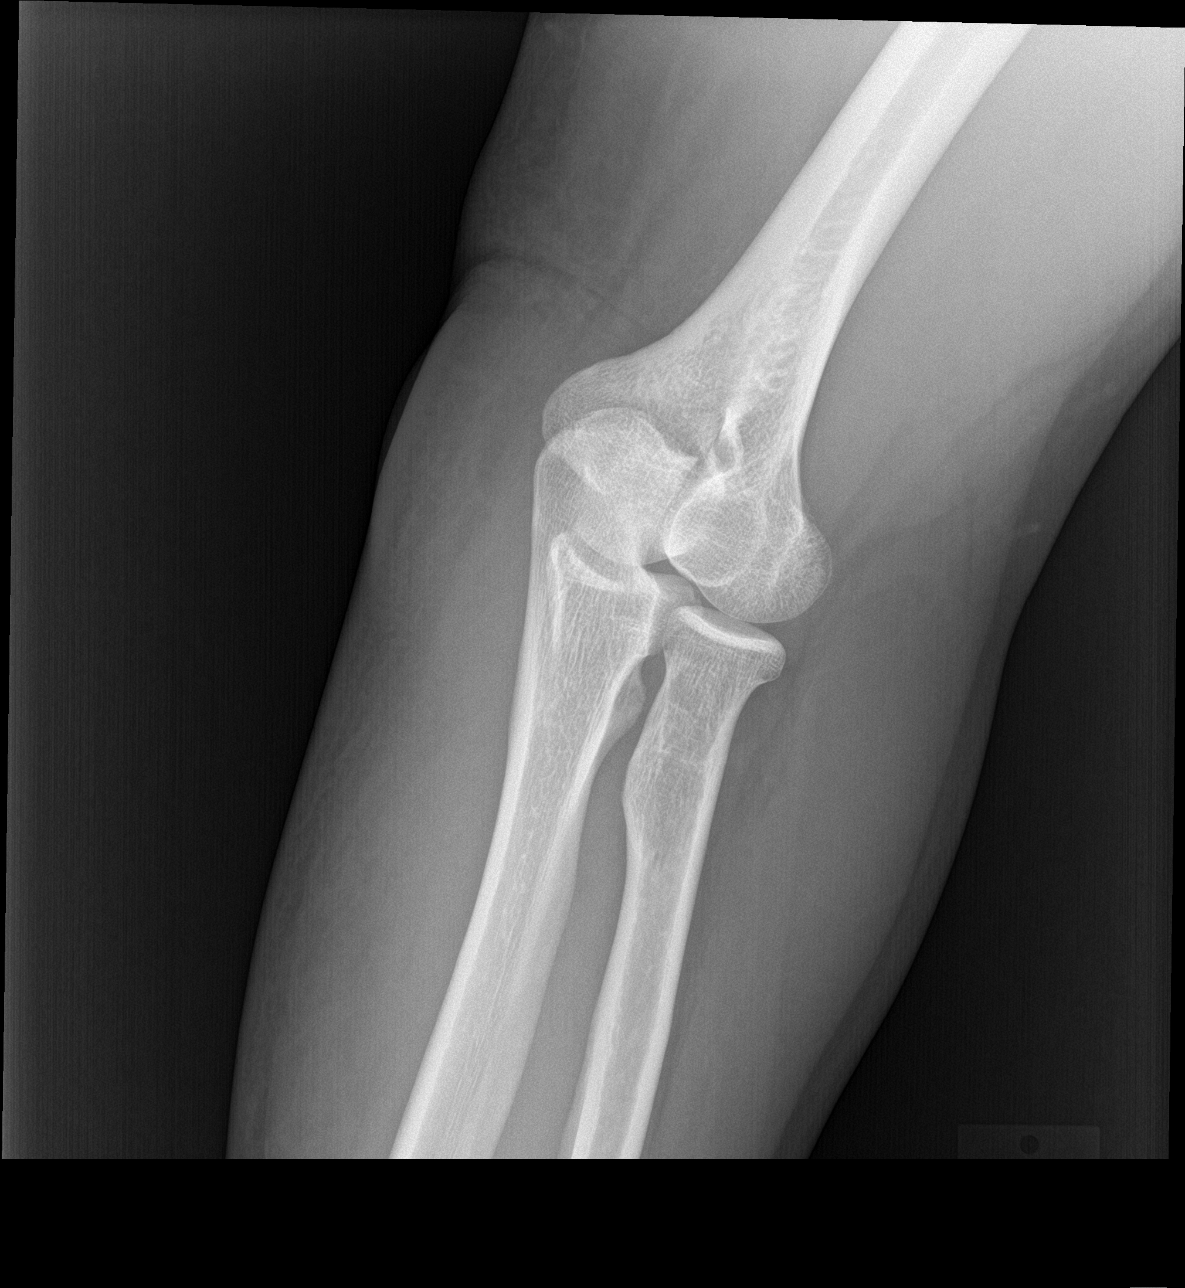

[elbow lat]
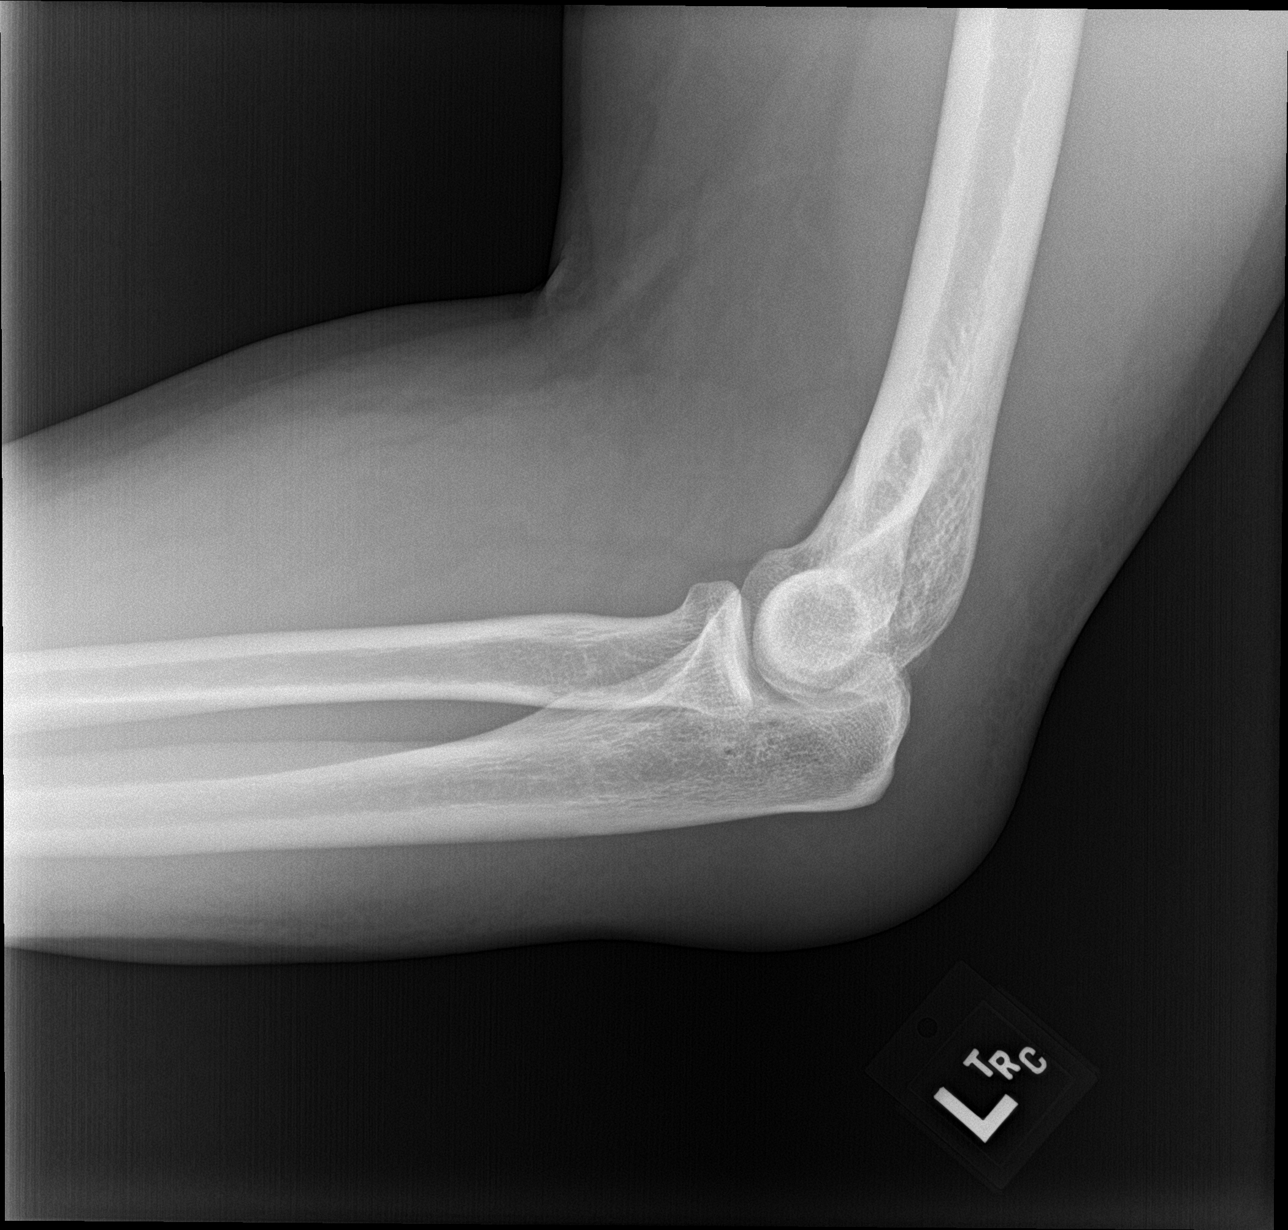

[elbow ap]
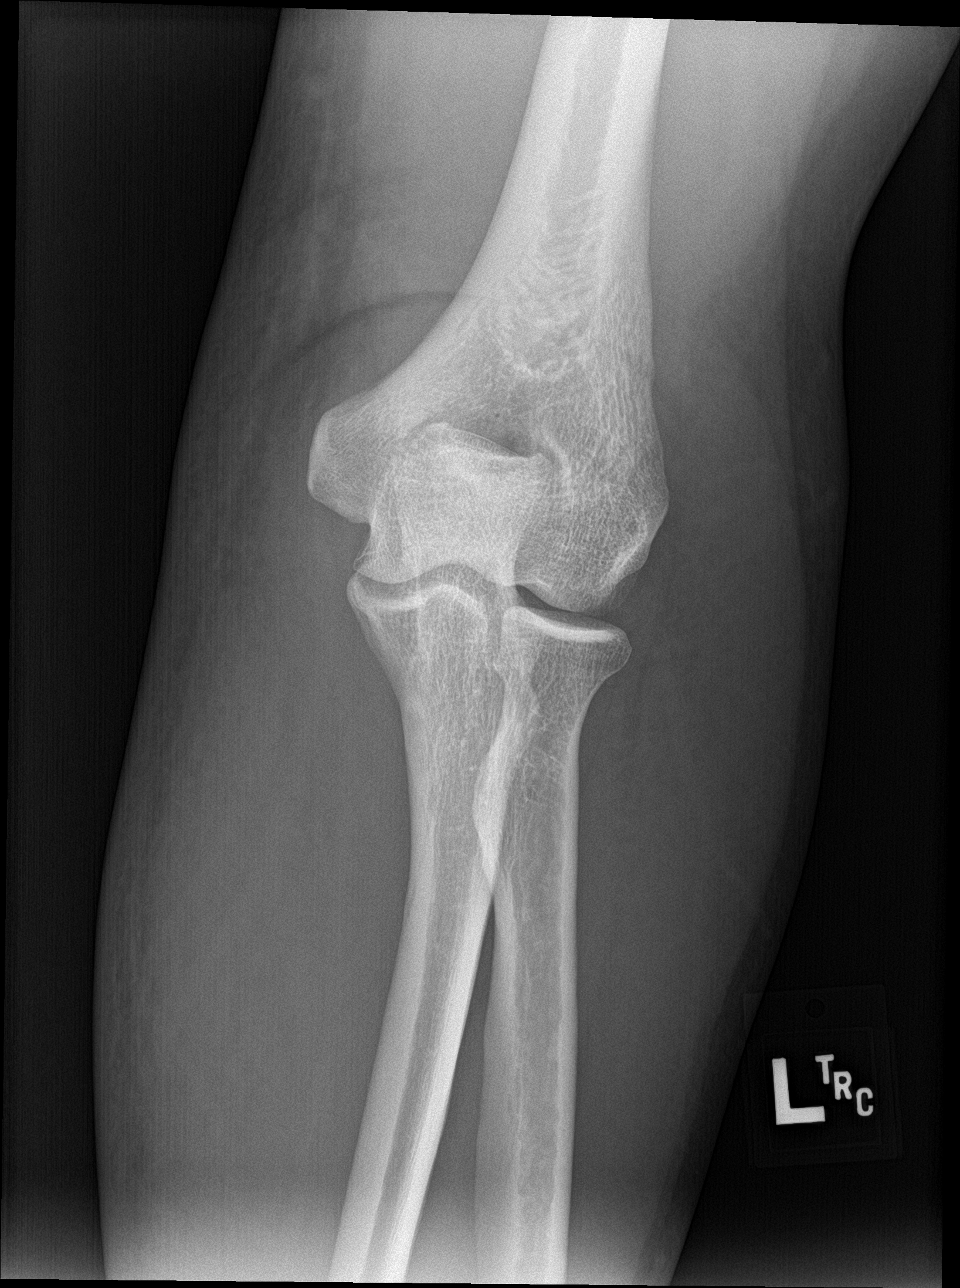

[4 of 4 positions shown; findings below may reference images not displayed]

FINDINGS: Soft tissue swelling posterior to the elbow, likely of the olecranon
bursa. No fracture, joint effusion, erosion, or soft tissue
calcification.
IMPRESSION: Soft tissue swelling around the olecranon. No underlying joint
effusion or osseous abnormality.

## 2023-10-28 ENCOUNTER — Other Ambulatory Visit: Payer: Self-pay | Admitting: Primary Care

## 2024-04-20 ENCOUNTER — Other Ambulatory Visit: Payer: Self-pay | Admitting: Internal Medicine

## 2024-04-22 NOTE — Telephone Encounter (Signed)
 Ok refill Singulair .  Per chart note from 09/07/2023:  Return in about 1 year (around 09/06/2024).

## 2024-08-07 ENCOUNTER — Ambulatory Visit: Payer: Self-pay | Admitting: Nurse Practitioner

## 2024-08-07 ENCOUNTER — Encounter: Payer: Self-pay | Admitting: Nurse Practitioner

## 2024-08-07 ENCOUNTER — Ambulatory Visit: Admitting: Nurse Practitioner

## 2024-08-07 VITALS — BP 122/90 | HR 72 | Temp 97.9°F | Ht 75.0 in | Wt 279.0 lb

## 2024-08-07 DIAGNOSIS — J4552 Severe persistent asthma with status asthmaticus: Secondary | ICD-10-CM

## 2024-08-07 DIAGNOSIS — Z0000 Encounter for general adult medical examination without abnormal findings: Secondary | ICD-10-CM

## 2024-08-07 DIAGNOSIS — E7800 Pure hypercholesterolemia, unspecified: Secondary | ICD-10-CM | POA: Diagnosis not present

## 2024-08-07 DIAGNOSIS — J302 Other seasonal allergic rhinitis: Secondary | ICD-10-CM

## 2024-08-07 DIAGNOSIS — B351 Tinea unguium: Secondary | ICD-10-CM

## 2024-08-07 DIAGNOSIS — Z23 Encounter for immunization: Secondary | ICD-10-CM

## 2024-08-07 DIAGNOSIS — B070 Plantar wart: Secondary | ICD-10-CM | POA: Diagnosis not present

## 2024-08-07 DIAGNOSIS — M170 Bilateral primary osteoarthritis of knee: Secondary | ICD-10-CM

## 2024-08-07 DIAGNOSIS — J3089 Other allergic rhinitis: Secondary | ICD-10-CM

## 2024-08-07 LAB — COMPREHENSIVE METABOLIC PANEL WITH GFR
ALT: 37 U/L (ref 0–53)
AST: 21 U/L (ref 0–37)
Albumin: 4.2 g/dL (ref 3.5–5.2)
Alkaline Phosphatase: 58 U/L (ref 39–117)
BUN: 21 mg/dL (ref 6–23)
CO2: 29 meq/L (ref 19–32)
Calcium: 9.1 mg/dL (ref 8.4–10.5)
Chloride: 105 meq/L (ref 96–112)
Creatinine, Ser: 1.11 mg/dL (ref 0.40–1.50)
GFR: 77.92 mL/min (ref >60.00)
Glucose, Bld: 101 mg/dL — ABNORMAL HIGH (ref 70–99)
Potassium: 4.3 meq/L (ref 3.5–5.1)
Sodium: 140 meq/L (ref 135–145)
Total Bilirubin: 0.5 mg/dL (ref 0.2–1.2)
Total Protein: 6.8 g/dL (ref 6.0–8.3)

## 2024-08-07 LAB — CBC WITH DIFFERENTIAL/PLATELET
Basophils Absolute: 0.1 K/uL (ref 0.0–0.1)
Basophils Relative: 1 % (ref 0.0–3.0)
Eosinophils Absolute: 0.5 K/uL (ref 0.0–0.7)
Eosinophils Relative: 9.3 % — ABNORMAL HIGH (ref 0.0–5.0)
HCT: 47.9 % (ref 39.0–52.0)
Hemoglobin: 15.9 g/dL (ref 13.0–17.0)
Lymphocytes Relative: 29.1 % (ref 12.0–46.0)
Lymphs Abs: 1.4 K/uL (ref 0.7–4.0)
MCHC: 33.3 g/dL (ref 30.0–36.0)
MCV: 92.5 fl (ref 78.0–100.0)
Monocytes Absolute: 0.4 K/uL (ref 0.1–1.0)
Monocytes Relative: 7.4 % (ref 3.0–12.0)
Neutro Abs: 2.6 K/uL (ref 1.4–7.7)
Neutrophils Relative %: 53.2 % (ref 43.0–77.0)
Platelets: 232 K/uL (ref 150.0–400.0)
RBC: 5.18 Mil/uL (ref 4.22–5.81)
RDW: 13.1 % (ref 11.5–15.5)
WBC: 4.9 K/uL (ref 4.0–10.5)

## 2024-08-07 LAB — LIPID PANEL
Cholesterol: 182 mg/dL (ref 0–200)
HDL: 30.2 mg/dL — ABNORMAL LOW (ref >39.00)
LDL Cholesterol: 133 mg/dL — ABNORMAL HIGH (ref 0–99)
NonHDL: 151.71
Total CHOL/HDL Ratio: 6
Triglycerides: 96 mg/dL (ref 0.0–149.0)
VLDL: 19.2 mg/dL (ref 0.0–40.0)

## 2024-08-07 MED ORDER — TERBINAFINE HCL 250 MG PO TABS: 250.0000 mg | ORAL_TABLET | Freq: Every day | ORAL | 0 refills | Status: AC

## 2024-08-07 NOTE — Assessment & Plan Note (Signed)
 Chronic, stable. Following with pulmonology. Continue trelegy inhaler daily and montelukast  10mg  daily. Can use albuterol  inhaler every 6 hours as needed for shortness of breath or wheezing. Prevnar 20 updated today.

## 2024-08-07 NOTE — Assessment & Plan Note (Signed)
 Check CMP, CBC, lipid panel today and treat based on results.

## 2024-08-07 NOTE — Progress Notes (Signed)
 New Patient Visit  BP (!) 122/90   Pulse 72   Temp 97.9 F (36.6 C)   Ht 6' 3 (1.905 m)   Wt 279 lb (126.6 kg)   SpO2 96%   BMI 34.87 kg/m    Subjective:    Patient ID: Jorge Morris, male    DOB: 1976/09/02, 48 y.o.   MRN: [id number]  CC: Chief Complaint  Patient presents with   Transfer of Care    Request  for a yearly physical, fasting labs, concerns with toenail fungus    HPI: Jorge Morris is a 48 y.o. male presents to transfer care to a new provider.  Introduced to Publishing rights manager role and practice setting.  All questions answered.  Discussed provider/patient relationship and expectations.  Discussed the use of AI scribe software for clinical note transcription with the patient, who gave verbal consent to proceed.  History of Present Illness   Jorge Morris is a 48 year old male who presents for an annual physical exam.  He has a history of asthma, managed with Trelegy and montelukast , and avoids smoke exposure. He follows up with a pulmonologist regularly.  He has two arthritic knees, limiting high-impact activities. He adapts by cycling and modifying exercises, using ibuprofen occasionally for pain.  He has toenail fungus on both feet, previously managed with over-the-counter treatments, and a wart on his foot he plans to treat with over-the-counter strips. The toenail fungus has started to return again and he is wondering if there is any prescription medication he can take to help.   He supplements his diet with avocado and yeast rice for cholesterol and monitors his blood pressure at home.     Depression and Anxiety Screen done:      08/07/2024    8:38 AM 03/02/2023   11:12 AM 08/25/2022    8:29 AM 08/25/2022    8:04 AM 08/25/2021    8:03 AM  Depression screen PHQ 2/9  Decreased Interest 0 0 0 0 0  Down, Depressed, Hopeless 0 0 0 0 0  PHQ - 2 Score 0 0 0 0 0  Altered sleeping 0  0    Tired, decreased energy 2  0    Change in appetite 0  3    Feeling  bad or failure about yourself  0  0    Trouble concentrating 0  0    Moving slowly or fidgety/restless 0  0    Suicidal thoughts 0  0    PHQ-9 Score 2  3    Difficult doing work/chores Not difficult at all          08/07/2024    8:38 AM 08/25/2022    8:29 AM 02/17/2020    4:07 PM  GAD 7 : Generalized Anxiety Score  Nervous, Anxious, on Edge 1 0 0  Control/stop worrying 0 0 0  Worry too much - different things 0 0 0  Trouble relaxing 1 0 0  Restless 1 0 0  Easily annoyed or irritable 0 0 0  Afraid - awful might happen 0 0 0  Total GAD 7 Score 3 0 0  Anxiety Difficulty Not difficult at all Not difficult at all     Past Medical History:  Diagnosis Date   Allergy    Asthma    Elevated liver enzymes    Hyperlipidemia    Osteoarthritis     Past Surgical History:  Procedure Laterality Date   TESTICLE SURGERY     VASECTOMY  2013    Family History  Problem Relation Age of Onset   Cancer Mother        breast   Healthy Father    Cancer Maternal Grandmother    Cancer Maternal Grandfather        lung   Colon cancer Neg Hx    Esophageal cancer Neg Hx    Rectal cancer Neg Hx    Stomach cancer Neg Hx      Social History   Tobacco Use   Smoking status: Never   Smokeless tobacco: Never  Vaping Use   Vaping status: Never Used  Substance Use Topics   Alcohol use: Yes    Comment: socially   Drug use: Never    Current Outpatient Medications on File Prior to Visit  Medication Sig Dispense Refill   albuterol  (VENTOLIN  HFA) 108 (90 Base) MCG/ACT inhaler Inhale 2 puffs into the lungs every 6 (six) hours as needed for wheezing or shortness of breath. 54 g 4   Fluticasone-Umeclidin-Vilant (TRELEGY ELLIPTA ) 200-62.5-25 MCG/ACT AEPB Inhale 1 puff then rinse mouth, once daily 180 each 4   montelukast  (SINGULAIR ) 10 MG tablet TAKE ONE TABLET BY MOUTH ONE TIME DAILY 30 tablet 3   Spacer/Aero-Holding Chambers (OPTICHAMBER DIAMOND) MISC SMARTSIG:Via Inhaler Every 4-6 Hours (Patient  taking differently: as needed.)     No current facility-administered medications on file prior to visit.     Review of Systems  Constitutional: Negative.   HENT:  Positive for sore throat. Negative for congestion, ear pain and sinus pressure.   Eyes: Negative.   Respiratory: Negative.    Cardiovascular: Negative.   Gastrointestinal: Negative.   Endocrine: Negative.   Genitourinary: Negative.   Musculoskeletal:  Positive for arthralgias (bilateral knees).  Skin: Negative.   Neurological: Negative.   Psychiatric/Behavioral: Negative.        Objective:    BP (!) 122/90   Pulse 72   Temp 97.9 F (36.6 C)   Ht 6' 3 (1.905 m)   Wt 279 lb (126.6 kg)   SpO2 96%   BMI 34.87 kg/m   Wt Readings from Last 3 Encounters:  08/07/24 279 lb (126.6 kg)  09/07/23 278 lb (126.1 kg)  03/02/23 286 lb 6.4 oz (129.9 kg)    BP Readings from Last 3 Encounters:  08/07/24 (!) 122/90  09/07/23 130/84  03/02/23 136/86    Physical Exam Vitals and nursing note reviewed.  Constitutional:      General: He is not in acute distress.    Appearance: Normal appearance.  HENT:     Head: Normocephalic and atraumatic.     Right Ear: Tympanic membrane, ear canal and external ear normal.     Left Ear: Tympanic membrane, ear canal and external ear normal.     Mouth/Throat:     Mouth: Mucous membranes are moist.     Pharynx: Posterior oropharyngeal erythema present. No oropharyngeal exudate.  Eyes:     Conjunctiva/sclera: Conjunctivae normal.  Cardiovascular:     Rate and Rhythm: Normal rate and regular rhythm.     Pulses: Normal pulses.     Heart sounds: Normal heart sounds.  Pulmonary:     Effort: Pulmonary effort is normal.     Breath sounds: Normal breath sounds.  Abdominal:     Palpations: Abdomen is soft.     Tenderness: There is no abdominal tenderness.  Musculoskeletal:        General: Normal range of motion.     Cervical back: Normal range  of motion and neck supple. No tenderness.      Right lower leg: No edema.     Left lower leg: No edema.  Lymphadenopathy:     Cervical: No cervical adenopathy.  Skin:    General: Skin is warm and dry.     Comments: Thickened white toenails on bilateral feet. [place]ucca to top of left foot  Neurological:     General: No focal deficit present.     Mental Status: He is alert and oriented to person, place, and time.     Cranial Nerves: No cranial nerve deficit.     Coordination: Coordination normal.     Gait: Gait normal.  Psychiatric:        Mood and Affect: Mood normal.        Behavior: Behavior normal.        Thought Content: Thought content normal.        Judgment: Judgment normal.       Assessment & Plan:   Problem List Items Addressed This Visit       Respiratory   Severe persistent asthma with status asthmaticus   Chronic, stable. Following with pulmonology. Continue trelegy inhaler daily and montelukast  10mg  daily. Can use albuterol  inhaler every 6 hours as needed for shortness of breath or wheezing. Prevnar 20 updated today.       Relevant Orders   Pneumococcal conjugate vaccine 20-valent (Completed)   Seasonal and perennial allergic rhinitis   Chronic, stable. Continue montelukast  10mg  daily.         Musculoskeletal and Integument   Primary osteoarthritis of both knees   Chronic, stable. He has modified his exercise limiting high impact activities and will take ibuprofen as needed. Follow-up with any concerns.         Other   Elevated LDL cholesterol level   Check CMP, CBC, lipid panel today and treat based on results.       Relevant Orders   CBC with Differential/Platelet   Comprehensive metabolic panel with GFR   Lipid panel   Routine general medical examination at a health care facility - Primary   Health maintenance reviewed and updated. Discussed nutrition, exercise. Follow-up 1 year.        Other Visit Diagnoses       Onychomycosis       Start terbinafine  250mg  daily for 12 weeks. Can  conitnue OTC treatments. Keep feet clean/dry.   Relevant Medications   terbinafine  (LAMISIL ) 250 MG tablet     Plantar wart       Start OTC wart strips, leave on for 2 days, then take off. Repeat as needed.   Relevant Medications   terbinafine  (LAMISIL ) 250 MG tablet     Immunization due       Prevnar 20 given today with history of asthma   Relevant Orders   Pneumococcal conjugate vaccine 20-valent (Completed)       IMMUNIZATIONS:   - Tdap: Tetanus vaccination status reviewed: last tetanus booster within 10 years. - Influenza: Postponed to flu season - Pneumovax: Not applicable - Prevnar: Administered today - HPV: Not applicable - Shingrix vaccine: Not applicable  SCREENING: - Colonoscopy: Up to date  Discussed with patient purpose of the colonoscopy is to detect colon cancer at curable precancerous or early stages   - AAA Screening: Not applicable   PATIENT COUNSELING:    Sexuality: Discussed sexually transmitted diseases, partner selection, use of condoms, avoidance of unintended pregnancy  and contraceptive alternatives.   Advised to  avoid cigarette smoking.  I discussed with the patient that most people either abstain from alcohol or drink within safe limits (<=14/week and <=4 drinks/occasion for males, <=7/weeks and <= 3 drinks/occasion for females) and that the risk for alcohol disorders and other health effects rises proportionally with the number of drinks per week and how often a drinker exceeds daily limits.  Discussed cessation/primary prevention of drug use and availability of treatment for abuse.   Diet: Encouraged to adjust caloric intake to maintain  or achieve ideal body weight, to reduce intake of dietary saturated fat and total fat, to limit sodium intake by avoiding high sodium foods and not adding table salt, and to maintain adequate dietary potassium and calcium preferably from fresh fruits, vegetables, and low-fat dairy products.    stressed the  importance of regular exercise  Injury prevention: Discussed safety belts, safety helmets, smoke detector, smoking near bedding or upholstery.   Dental health: Discussed importance of regular tooth brushing, flossing, and dental visits.   Follow up plan: NEXT PREVENTATIVE PHYSICAL DUE IN 1 YEAR.  Follow up plan: No follow-ups on file.  Edelin Fryer A Melisha Eggleton

## 2024-08-07 NOTE — Assessment & Plan Note (Signed)
 Health maintenance reviewed and updated. Discussed nutrition, exercise. Follow-up 1 year.

## 2024-08-07 NOTE — Patient Instructions (Signed)
 It was great to see you!  Start terbinafine  1 tablet daily for 12 weeks   You can use [facility] equate strips for wart removal   Let's follow-up in 1 year, sooner if you have concerns.  If a referral was placed today, you will be contacted for an appointment. Please note that routine referrals can sometimes take up to 3-4 weeks to process. Please call our office if you haven't heard anything after this time frame.  Take care,  Tinnie Harada, NP

## 2024-08-07 NOTE — Assessment & Plan Note (Signed)
Chronic, stable.  Continue montelukast 10 mg daily.

## 2024-08-07 NOTE — Assessment & Plan Note (Signed)
 Chronic, stable. He has modified his exercise limiting high impact activities and will take ibuprofen as needed. Follow-up with any concerns.

## 2024-08-08 ENCOUNTER — Other Ambulatory Visit: Payer: Self-pay | Admitting: Internal Medicine

## 2024-09-06 ENCOUNTER — Ambulatory Visit: Payer: 59 | Admitting: Internal Medicine

## 2024-10-22 ENCOUNTER — Other Ambulatory Visit: Payer: Self-pay | Admitting: Internal Medicine

## 2024-11-06 ENCOUNTER — Other Ambulatory Visit: Payer: Self-pay | Admitting: Internal Medicine

## 2024-11-21 ENCOUNTER — Other Ambulatory Visit: Payer: Self-pay | Admitting: Internal Medicine

## 2024-11-22 NOTE — Telephone Encounter (Signed)
 Patient needs appointment for farther refills

## 2024-12-20 ENCOUNTER — Other Ambulatory Visit: Payer: Self-pay | Admitting: Internal Medicine

## 2025-01-19 ENCOUNTER — Other Ambulatory Visit: Payer: Self-pay | Admitting: Internal Medicine
